# Patient Record
Sex: Male | Born: 1935 | Race: White | Hispanic: No | Marital: Married | Smoking: Former smoker
Health system: Southern US, Community
[De-identification: ages and names within clinical notes are randomized; demographics above are authoritative.]

## PROBLEM LIST (undated history)

## (undated) DIAGNOSIS — F0280 Dementia in other diseases classified elsewhere without behavioral disturbance: Secondary | ICD-10-CM

## (undated) DIAGNOSIS — G309 Alzheimer's disease, unspecified: Secondary | ICD-10-CM

## (undated) DIAGNOSIS — I10 Essential (primary) hypertension: Secondary | ICD-10-CM

## (undated) HISTORY — DX: Dementia in other diseases classified elsewhere, unspecified severity, without behavioral disturbance, psychotic disturbance, mood disturbance, and anxiety: F02.80

## (undated) HISTORY — DX: Essential (primary) hypertension: I10

## (undated) HISTORY — DX: Alzheimer's disease, unspecified: G30.9

---

## 2003-11-01 ENCOUNTER — Ambulatory Visit: Payer: Self-pay | Admitting: General Surgery

## 2005-08-02 ENCOUNTER — Ambulatory Visit: Payer: Self-pay | Admitting: General Surgery

## 2005-12-04 ENCOUNTER — Ambulatory Visit: Payer: Self-pay | Admitting: Urology

## 2005-12-16 ENCOUNTER — Ambulatory Visit: Payer: Self-pay | Admitting: Internal Medicine

## 2006-02-18 ENCOUNTER — Inpatient Hospital Stay: Payer: Self-pay | Admitting: Urology

## 2006-02-19 ENCOUNTER — Other Ambulatory Visit: Payer: Self-pay

## 2006-02-23 ENCOUNTER — Emergency Department: Payer: Self-pay | Admitting: Internal Medicine

## 2006-03-24 ENCOUNTER — Ambulatory Visit: Payer: Self-pay | Admitting: Urology

## 2006-03-27 ENCOUNTER — Ambulatory Visit: Payer: Self-pay | Admitting: Urology

## 2008-03-15 ENCOUNTER — Ambulatory Visit: Payer: Self-pay | Admitting: Specialist

## 2008-03-23 ENCOUNTER — Ambulatory Visit: Payer: Self-pay | Admitting: Specialist

## 2008-10-27 IMAGING — CR DG ABDOMEN 3V
1 series · 5 of 5 positions shown · non-contrast
Comparison: none

REASON FOR EXAM: abdominal pain, vomiting, [HOSPITAL]
COMMENTS:

[Series 1: view not recorded · 0.17mm/px · 5 of 5 slices shown]
[im 1/5]
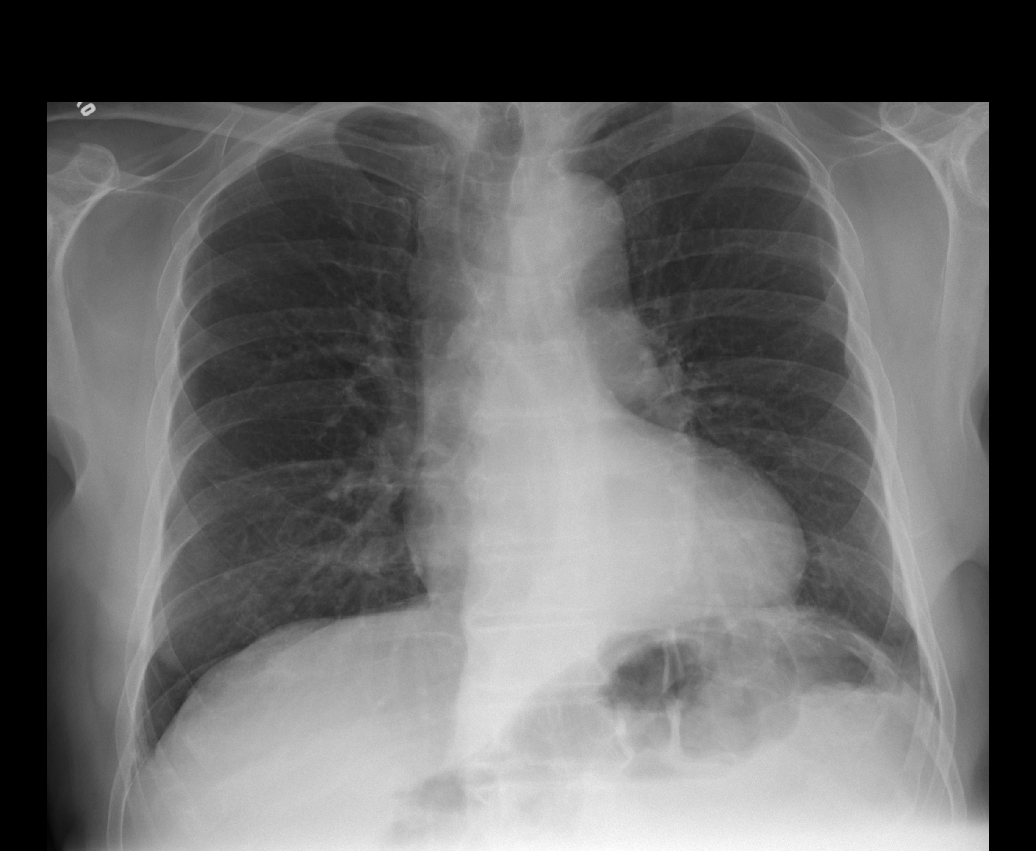
[im 2/5]
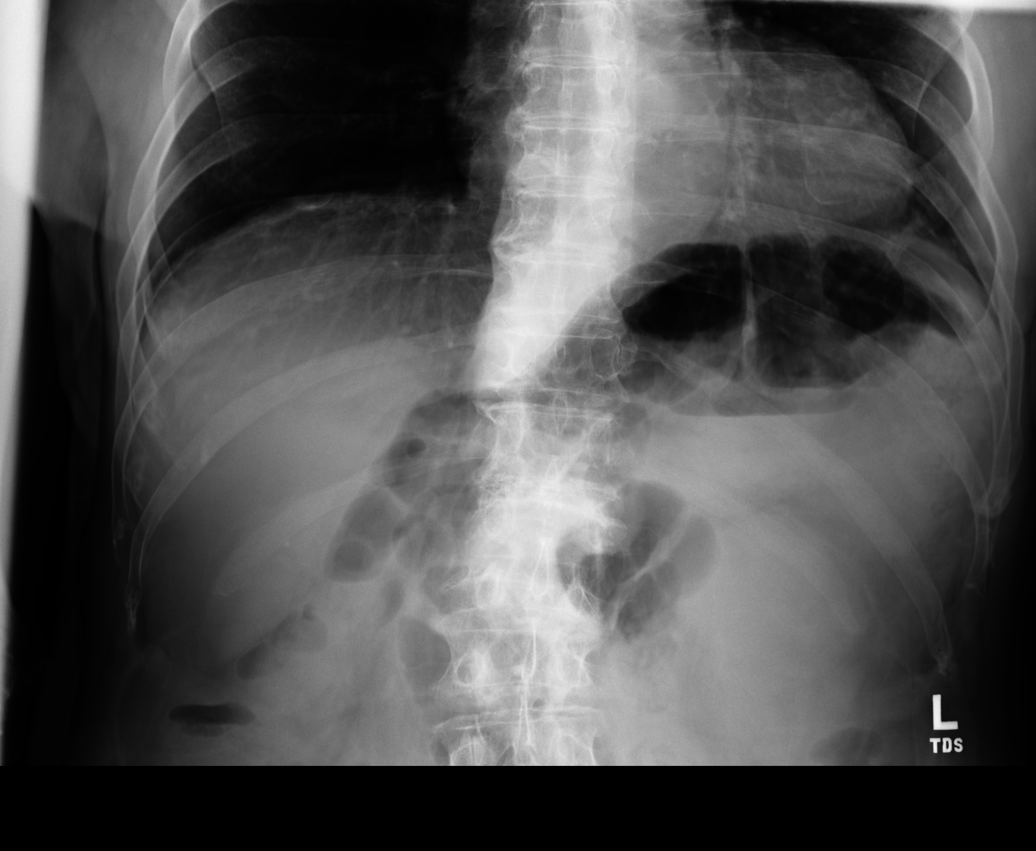
[im 3/5]
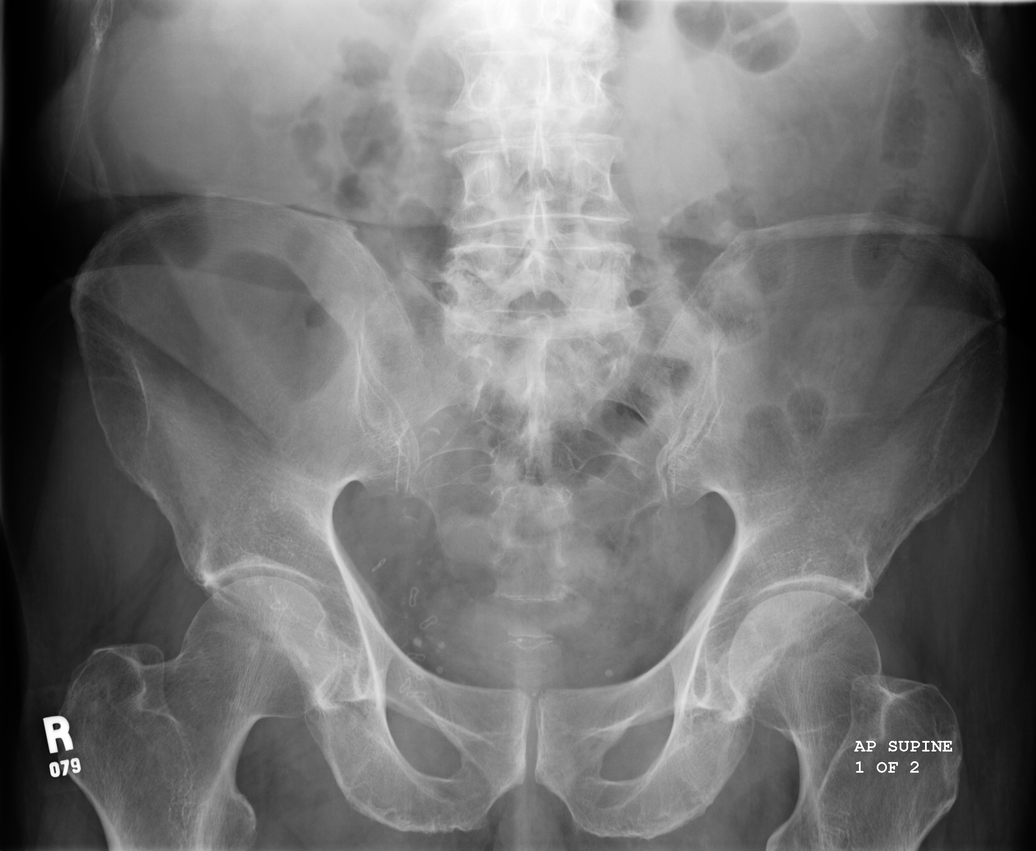
[im 4/5]
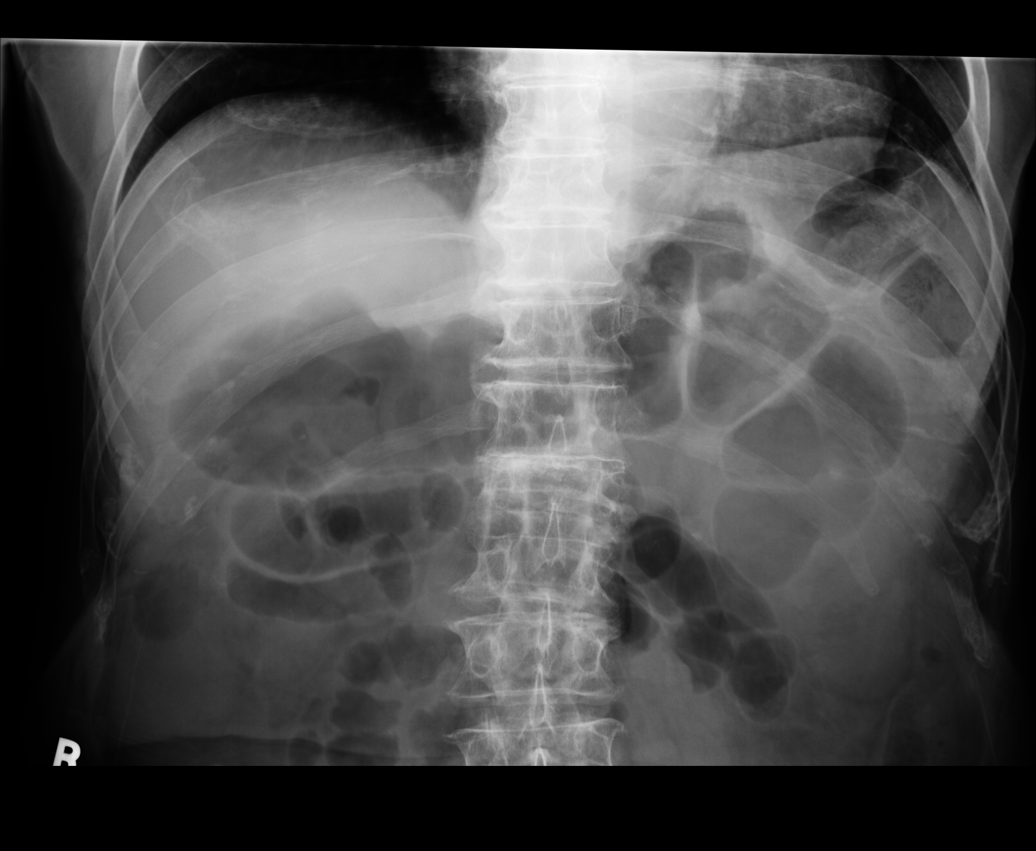
[im 5/5]
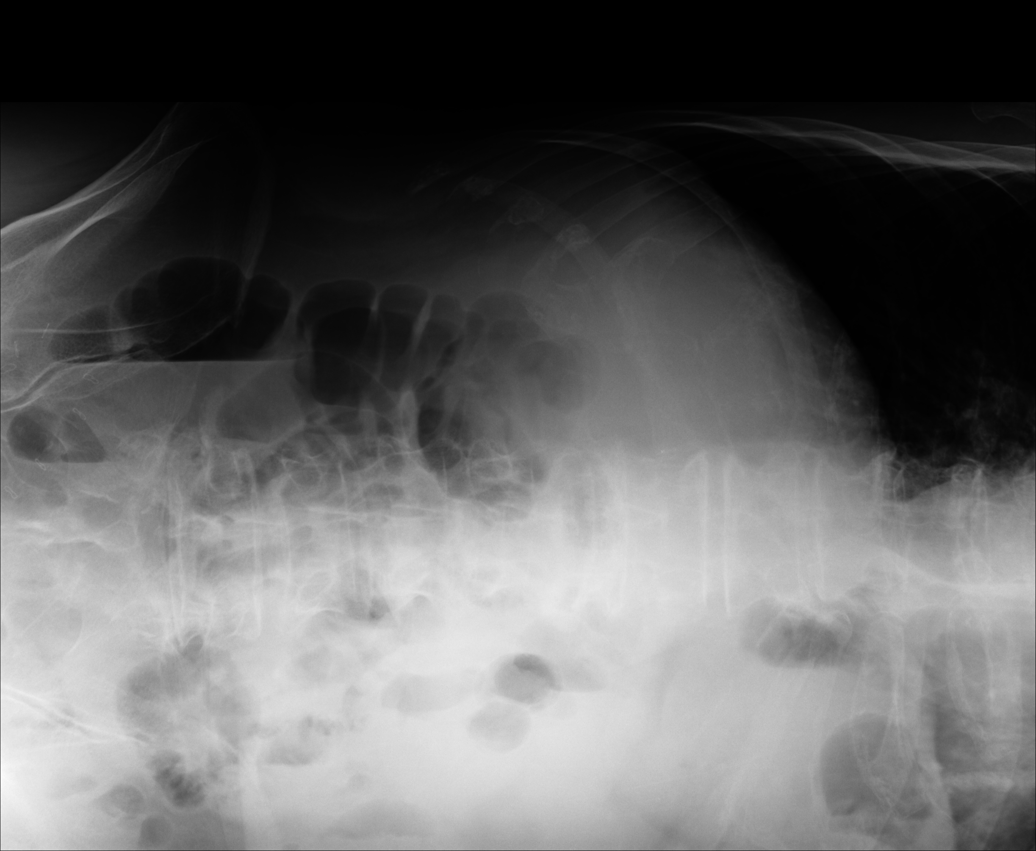

[5 of 5 positions shown; findings below may reference images not displayed]

PROCEDURE:     DXR - DXR ABDOMEN 3-WAY (INCL PA CXR)  - February 18, 2006  [DATE]

RESULT:     The patient is complaining of abdominal discomfort.

The chest film is compared to a study 16 December, 2005. The lungs are
adequately inflated and clear. The heart is top normal in size. There is
tortuosity of the descending thoracic aorta. There is no evidence of CHF or
pneumonia.

Views of the abdomen reveal a relatively nonspecific bowel gas pattern.
There is no evidence of obstruction, perforation, or ileus. There are
phleboliths within the pelvis. There is some gas within loops of small bowel.
IMPRESSION: 1. There is no evidence of bowel obstruction or perforation. There may be
gastroenteritis present manifested by the small amount of gas within small
bowel loops as well as in the large bowel. Follow-up films or CT scanning
would be of value.
2. I do not see evidence of acute cardiopulmonary abnormality.
3. The patient has sustained a partial compression of the body of
approximately L1.

## 2008-11-01 IMAGING — CR DG ABDOMEN 1V
1 series · 1 of 1 positions shown · non-contrast
Comparison: none

REASON FOR EXAM: Kidney stones
COMMENTS:

[view not recorded]
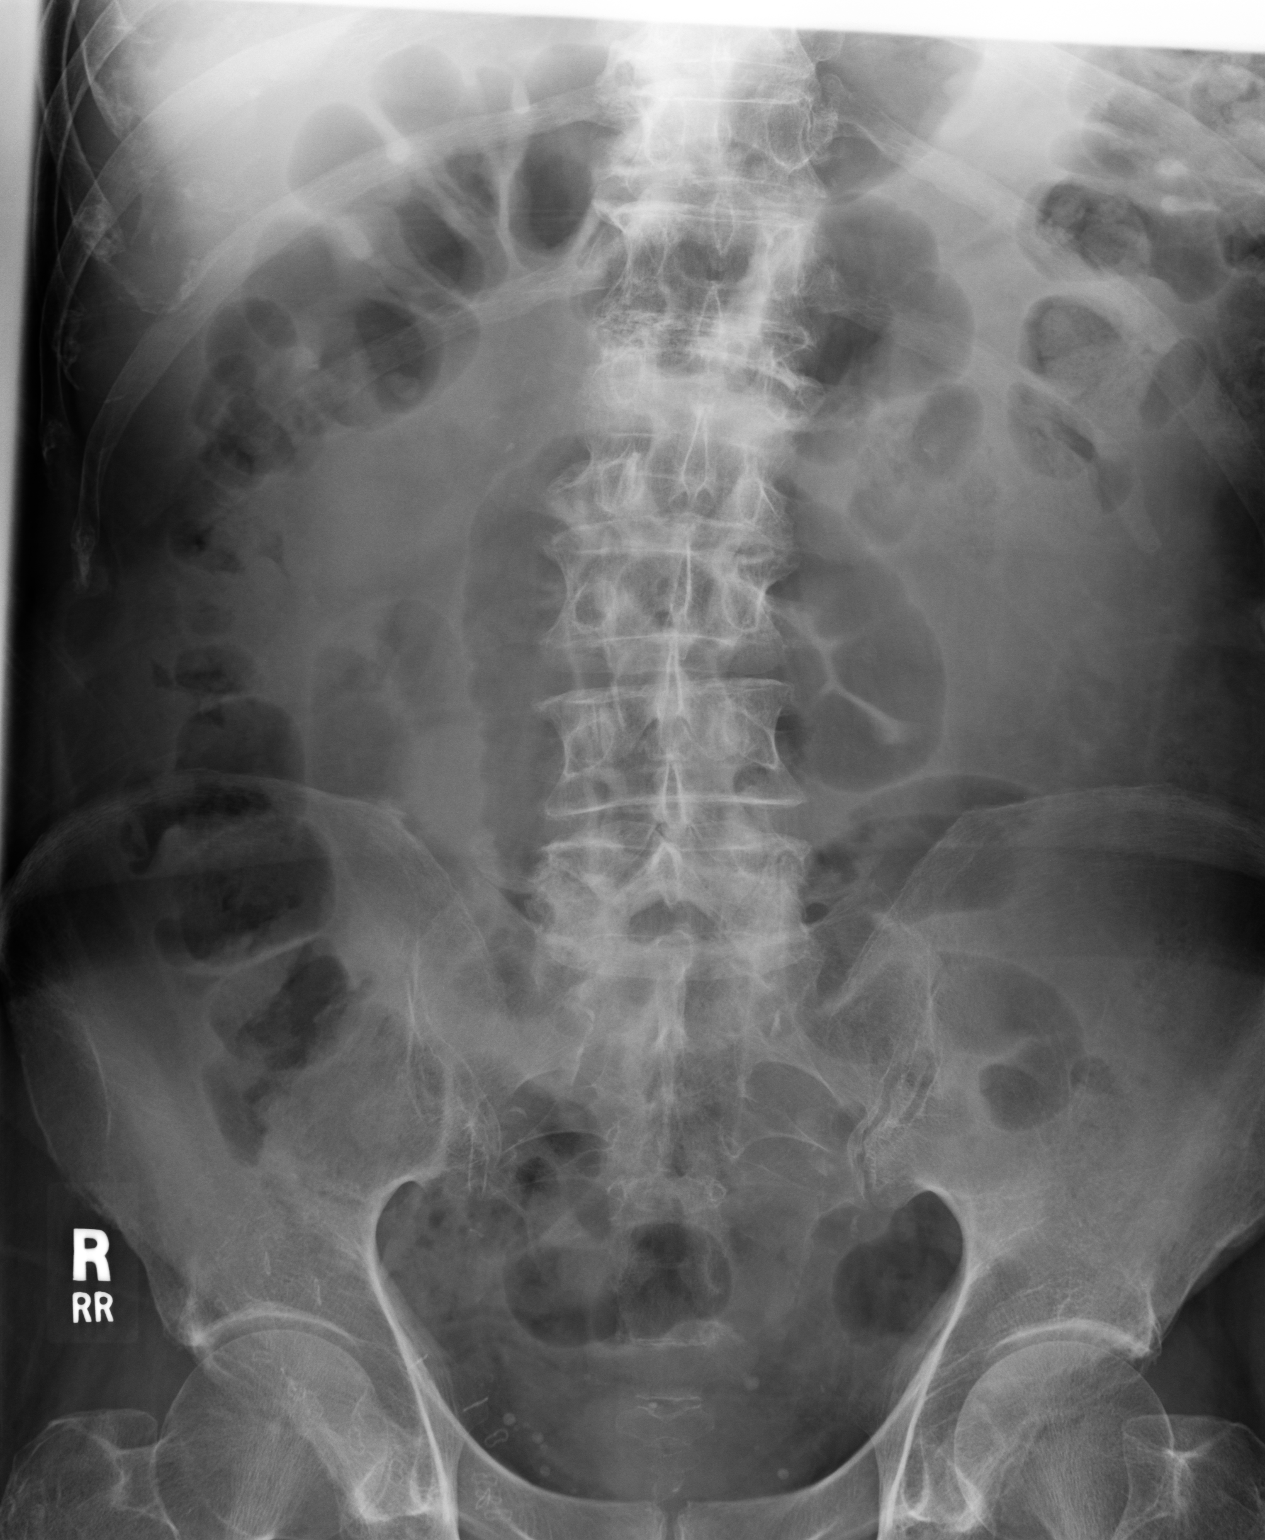

[1 of 1 positions shown; findings below may reference images not displayed]

PROCEDURE:     DXR - DXR KIDNEY URETER BLADDER  - February 23, 2006  [DATE]

RESULT:

The bowel gas pattern is relatively nonspecific. There is no evidence of
obstruction or ileus. There is a moderate amount of gas within loops of
small bowel.  I do not see evidence of extraluminal gas collections.  There
are phleboliths within the pelvis.  There are degenerative changes of lumbar
spine and there is likely a compression of the body of T12.
IMPRESSION: I do not see acute intra-abdominal abnormality on this study. Specifically,
I do not see objective evidence of calcified urinary tract stones. There is
considerable stool and gas overlying the kidneys that could certainly
obscure tiny stones. Further evaluation with CT scanning or IVP is available
upon request.

## 2009-10-30 ENCOUNTER — Ambulatory Visit: Payer: Self-pay | Admitting: Internal Medicine

## 2011-10-01 ENCOUNTER — Ambulatory Visit: Payer: Self-pay | Admitting: Neurology

## 2012-07-15 ENCOUNTER — Inpatient Hospital Stay: Payer: Self-pay | Admitting: Internal Medicine

## 2012-07-15 LAB — URINALYSIS, COMPLETE
Blood: NEGATIVE
Glucose,UR: NEGATIVE mg/dL (ref 0–75)
Ketone: NEGATIVE
Leukocyte Esterase: NEGATIVE
Nitrite: NEGATIVE
RBC,UR: <1 /HPF (ref 0–5)
Specific Gravity: 1.018 (ref 1.003–1.030)
Squamous Epithelial: NONE SEEN

## 2012-07-15 LAB — CBC WITH DIFFERENTIAL/PLATELET
Basophil #: 0.1 10*3/uL (ref 0.0–0.1)
Basophil #: 0.1 10*3/uL (ref 0.0–0.1)
Basophil %: 1.6 %
Basophil %: 1.5 %
Eosinophil #: 0 10*3/uL (ref 0.0–0.7)
Eosinophil %: 0.1 %
Eosinophil %: 0.6 %
HCT: 21.4 % — ABNORMAL LOW (ref 40.0–52.0)
Lymphocyte #: 1.5 10*3/uL (ref 1.0–3.6)
Lymphocyte %: 14.3 %
MCH: 27.6 pg (ref 26.0–34.0)
MCH: 28.4 pg (ref 26.0–34.0)
MCHC: 32.7 g/dL (ref 32.0–36.0)
MCHC: 33.8 g/dL (ref 32.0–36.0)
MCV: 84 fL (ref 80–100)
Monocyte #: 0.7 x10 3/mm (ref 0.2–1.0)
Monocyte %: 12.3 %
Monocyte %: 11.2 %
Neutrophil #: 4.3 10*3/uL (ref 1.4–6.5)
Neutrophil #: 4.5 10*3/uL (ref 1.4–6.5)
Neutrophil %: 71.7 %
Platelet: 230 10*3/uL (ref 150–440)
Platelet: 183 10*3/uL (ref 150–440)
RBC: 2.54 10*6/uL — ABNORMAL LOW (ref 4.40–5.90)
RDW: 14.7 % — ABNORMAL HIGH (ref 11.5–14.5)
RDW: 15.3 % — ABNORMAL HIGH (ref 11.5–14.5)
WBC: 6.9 10*3/uL (ref 3.8–10.6)

## 2012-07-15 LAB — COMPREHENSIVE METABOLIC PANEL
Alkaline Phosphatase: 59 U/L (ref 50–136)
Anion Gap: 10 (ref 7–16)
BUN: 32 mg/dL — ABNORMAL HIGH (ref 7–18)
Bilirubin,Total: 0.3 mg/dL (ref 0.2–1.0)
Chloride: 108 mmol/L — ABNORMAL HIGH (ref 98–107)
Co2: 21 mmol/L (ref 21–32)
EGFR (African American): 53 — ABNORMAL LOW
EGFR (Non-African Amer.): 45 — ABNORMAL LOW
Glucose: 115 mg/dL — ABNORMAL HIGH (ref 65–99)
Osmolality: 285 (ref 275–301)
Potassium: 3.7 mmol/L (ref 3.5–5.1)
SGOT(AST): 17 U/L (ref 15–37)
Sodium: 139 mmol/L (ref 136–145)
Total Protein: 6 g/dL — ABNORMAL LOW (ref 6.4–8.2)

## 2012-07-15 LAB — PROTIME-INR
INR: 1.1
Prothrombin Time: 14.8 secs — ABNORMAL HIGH (ref 11.5–14.7)

## 2012-07-15 LAB — APTT: Activated PTT: 24.6 secs (ref 23.6–35.9)

## 2012-07-16 LAB — CBC WITH DIFFERENTIAL/PLATELET
Basophil #: 0.1 10*3/uL (ref 0.0–0.1)
Eosinophil #: 0.1 10*3/uL (ref 0.0–0.7)
Eosinophil %: 0.9 %
HCT: 22.6 % — ABNORMAL LOW (ref 40.0–52.0)
HGB: 7.8 g/dL — ABNORMAL LOW (ref 13.0–18.0)
Lymphocyte #: 0.8 10*3/uL — ABNORMAL LOW (ref 1.0–3.6)
Lymphocyte %: 12.8 %
MCHC: 34.6 g/dL (ref 32.0–36.0)
MCV: 84 fL (ref 80–100)
Monocyte #: 0.7 x10 3/mm (ref 0.2–1.0)
Monocyte %: 11.1 %
Neutrophil %: 74.2 %
Platelet: 187 10*3/uL (ref 150–440)
RBC: 2.69 10*6/uL — ABNORMAL LOW (ref 4.40–5.90)
RDW: 15.1 % — ABNORMAL HIGH (ref 11.5–14.5)

## 2012-07-16 LAB — BASIC METABOLIC PANEL
Anion Gap: 6 — ABNORMAL LOW (ref 7–16)
EGFR (African American): >60
Glucose: 94 mg/dL (ref 65–99)
Osmolality: 282 (ref 275–301)
Sodium: 139 mmol/L (ref 136–145)

## 2012-07-16 LAB — OCCULT BLOOD X 1 CARD TO LAB, STOOL: Occult Blood, Feces: POSITIVE

## 2012-07-17 LAB — HEMOGLOBIN: HGB: 7.8 g/dL — ABNORMAL LOW (ref 13.0–18.0)

## 2014-05-20 NOTE — Consult Note (Signed)
Brief Consult Note: Diagnosis: Low hemoglobin.   Patient was seen by consultant.   Consult note dictated.   Comments: Appreciate consult for 81 y/o caucasian man with history of atrial fibrillation, s/p Pradaxa therapy which has been recently held, for evaluation for concerns of GI bleeding. Pt and son report that he has been in his usual state of health until this week, when he became increasingly weak, sob, and fatigued. They also report a history of intermittent black stools over the last month. Last occurence was today. States he has occasional heartburn, but only if he eats too fast. States he does not take NSAIDs, but does have a headache pill he cannot remember the name of.  Denies abdominal pain, NVD, problems swallowing, and all further GI complaints. No history of EGD. Did have colonoscopy 2005 by Dr Jamal Collin that revealed a normal colon per report. Hgb today was 4.4, normal platelets. PT 14.8, INR 1.1. He is hemodynamically stable. Impression and Plan: Melena, Anemia: concerning for GIB- slow over the last month. He is received PRBCS, would slowly transfuse up to hgb of 8. Agree with PPI therapy. Would also recommend serial hgbs and will plan for EGD when clinically feasible.  Electronic Signatures: Stephens November H (NP)  (Signed 18-Jun-14 18:19)  Authored: Brief Consult Note   Last Updated: 18-Jun-14 18:19 by Wallice Demark (NP)

## 2014-05-20 NOTE — Consult Note (Signed)
Chief Complaint:  Subjective/Chief Complaint Please see EGD report.  No active bleeding lesion and no lesion with reliable stigmata.  Possible sources include erosive esophagitis, erosive gastritis.  Patietn also has some duodenitis.  Bleeding form one of the above likely much exacerbated by the anticoagulation.   Will get h. pylori serology.  continue bid ppi, also as outpatient.  GI o/p fu in 3 weeks.   Electronic Signatures: Loistine Simas (MD)  (Signed 19-Jun-14 13:38)  Authored: Chief Complaint   Last Updated: 19-Jun-14 13:38 by Loistine Simas (MD)

## 2014-05-20 NOTE — H&P (Signed)
PATIENT NAME:  Kyle Barron, Kyle Barron MR#:  [id number] DATE OF BIRTH:  1920-12-12  DATE OF ADMISSION:  07/15/2012  PRIMARY CARE PHYSICIAN: Jaquelyn Bitter B. Brynda Greathouse, MD  PRIMARY CARDIOLOGIST: Cletis Athens, MD   CHIEF COMPLAINT: Low hemoglobin.   HISTORY OF PRESENT ILLNESS: A very pleasant 81 year old male with a history of atrial fibrillation, he was on Pradaxa, who presented with the above complaint. Over the past several days, the patient has been very lethargic, short of breath and had dyspnea on exertion. They went to see his PCP yesterday, where they did a hemoglobin. They got a call today saying his hemoglobin is critically low, to come to the ER for further evaluation. Indeed, his hemoglobin is 4.4 here in the ER with a hematocrit of 13.3. He has consented packed red blood cells.   REVIEW OF SYSTEMS:  CONSTITUTIONAL: No fever. Positive fatigue, weakness. EYES: No blurred or double vision.  ENT: No ear pain, hearing loss, seasonal allergies, postnasal drip.  RESPIRATORY: No cough, wheezing, hemoptysis, COPD. Positive dyspnea.  CARDIOVASCULAR: No chest pain, palpitations, orthopnea, edema, arrhythmia. Positive dyspnea on exertion.  GASTROINTESTINAL: No nausea, vomiting, diarrhea, abdominal pain. He has had melena.  GENITOURINARY: No dysuria or hematuria.  ENDOCRINE: No polyuria or polydipsia.  HEMATOLOGIC AND LYMPHATIC: Positive easy bruising on Pradaxa.  SKIN: No rashes or lesions.  MUSCULOSKELETAL: No limited activity. He has been working 8 to 10 hours up until about a couple weeks ago.  PSYCHIATRIC: No history of anxiety.   PAST MEDICAL HISTORY: 1. Atrial fibrillation.  2. Hypertension.  3. BPH.  4. Dementia.   PAST SURGICAL HISTORY: Hernia repair.   MEDICATIONS:  1. Vitamin D3 one tablet daily.  2. Losartan 50 mg daily.  3. Synthroid 100 mcg daily.  4. Doxazosin 4 mg daily.  5. Atenolol 25 mg daily.  6. Aspirin 81 mg daily.  He was recently taken off of Abilify and Pradaxa.    ALLERGIES: DEMEROL.   SOCIAL HISTORY: No tobacco, alcohol or drug use.   FAMILY HISTORY: Positive for diabetes.   PHYSICAL EXAMINATION:  VITAL SIGNS: Temperature 97.8, pulse is 96, respirations 16, blood pressure 96/55, 92% on room air.  GENERAL: The patient is alert, oriented, not in acute distress.  HEENT: Head is atraumatic. Pupils are round and reactive. Sclerae are anicteric. Mucous membranes are dry. Oropharynx is clear.  NECK: Supple without JVD, carotid bruit or enlarged thyroid.  CARDIOVASCULAR: Regular rate and rhythm. He has a 3/6 systolic ejection murmur heard best at the right sternal border, without radiation. PMI is not displaced.  LUNGS: Clear to auscultation without crackles, rales, rhonchi or wheezing. Normal percussion. Good respiratory effort. Symmetrical rise and fall.  ABDOMEN: Bowel sounds are positive. Nontender, nondistended. No hepatosplenomegaly. No rebound, guarding, rigidity or ecchymosis.  EXTREMITIES: No clubbing, cyanosis or edema.  NEUROLOGIC: Cranial nerves II through XII are grossly intact. There are no focal deficits. Motor strength 4/4 bilateral upper and lower extremities.  SKIN: The patient is pale, but no rash or lesions are noted.   LABORATORY DATA: White blood cells 5.9, hemoglobin 4.4, hematocrit 13.3, platelets are 230. Sodium 139, potassium 3.7, chloride 108, bicarbonate 21, BUN 32, creatinine 1.36, glucose 115, calcium 8.4, bilirubin 0.3, alkaline phosphatase 59, AST 17, ALT 18, total protein 6.0, albumin 3.1. INR is 1.1.   ELECTROCARDIOGRAM: Pending.   ASSESSMENT AND PLAN: A 81 year old male who presents with melena, dyspnea on exertion, shortness of breath, fatigue, with hemoglobin of 4.4, who was on Pradaxa.   1. Upper  gastrointestinal bleed secondary to Pradaxa. The patient has consented for PRBCs. He will be transfused 2 units of PRBCs. I have placed him on Protonix 40 IV q.12 hours. Will hold p.o. medications for now, and GI consultation  has been placed.  2. History of hypertension. Now, the patient is relatively hypotensive due to problem #1. Will hold all medications.  3. Dementia. The patient has recently been taken off of his Abilify.   4. Renal insufficiency. Creatinine is 1.36. I suspect this is either chronic in nature or related to his hypoperfusion from his significant anemia. Will monitor closely and repeat a BMP in the a.m.   CODE STATUS: The patient is a full code status.   CRITICAL CARE TIME SPENT: Approximately 50 minutes.   ____________________________ Donell Beers. Benjie Karvonen, MD spm:OSi D: 07/15/2012 12:29:07 ET T: 07/15/2012 13:32:37 ET JOB#: [id number]  cc: Latrese Carolan P. Benjie Karvonen, MD, <Dictator> Cletis Athens, MD Mikeal Hawthorne. Brynda Greathouse, MD Donell Beers Darlis Wragg MD ELECTRONICALLY SIGNED 07/15/2012 14:52

## 2014-05-20 NOTE — Discharge Summary (Signed)
PATIENT NAME:  Kyle Barron, Kyle Barron MR#:  [id number] DATE OF BIRTH:  03-16-1920  DATE OF ADMISSION:  07/15/2012 DATE OF DISCHARGE:  07/17/2012  ADMISSION DIAGNOSIS: Upper gastrointestinal bleed.   DISCHARGE DIAGNOSES: 1. Upper gastrointestinal bleed secondary to grade C esophagitis, any gastritis.  2. Acute blood loss anemia, status post 2 units of PRBCs.  3. History of atrial fibrillation.  4. Hypertension.   CONSULTATION: Dr. Nehemiah Massed.   PROCEDURE:  07/16/2012, the patient underwent an EGD which showed LA grade C erosive esophagitis and erosive gastritis and duodenitis with short segment Barrett's esophagus.   Discharge hemoglobin is 7.8.  H. pylori is pending.    HOSPITAL COURSE: A 81 year old male who was on Pradaxa has presented with a low hemoglobin from his doctor's office. For further details, please refer to the H and P.   Upper GI bleed. The patient has been on Pradaxa. This is likely a slow bleed from the Pradaxa.  His EGD showed grade C esophagitis, gastritis, duodenitis.  GI recommended PPI for 6 weeks b.i.d., which he will continue on daily. He obviously should not be a Pradaxa, aspirin was held as well.   Acute blood loss anemia due to upper GI bleed, status post 2 units of PRBCs. His hemoglobin has remained stable.   History of atrial fibrillation: The patient is now off of aspirin and Pradaxa. He will need to follow up with his physician but due to the upper GI bleed; this is unsafe for him at this time.   Hypertension: The patient was restarted on his outpatient medications.   DISCHARGE MEDICATIONS: 1. Synthroid 100 mcg daily.  2. Atenolol 25 mg daily.  3. Vitamin D3 1 tablet daily. 4. Pantoprazole 40 mg 1 tablet b.i.d. for 6 weeks, then once daily.   DISCHARGE DIET: Regular diet.   DISCHARGE ACTIVITY: As tolerated. We have taken the patient off of Losartan and Zosyn due to normotensive blood pressures. He will monitor his blood pressures and then restart if  systolic blood pressure greater than 140.  He will need to follow up with his primary care physician in 1 to 2 days.   TIME SPENT:  Approximately 35 minutes. The patient is medically stable for discharge.   ____________________________ Romesha Scherer P. Benjie Karvonen, MD spm:rw D: 07/17/2012 13:56:07 ET T: 07/17/2012 15:32:27 ET JOB#: [id number]  cc: Rosaisela Jamroz P. Benjie Karvonen, MD, <Dictator> Mikeal Hawthorne. Brynda Greathouse, MD Donell Beers Brance Dartt MD ELECTRONICALLY SIGNED 07/17/2012 21:06

## 2014-05-20 NOTE — Consult Note (Signed)
PATIENT NAME:  Kyle, Barron MR#:  [id number] DATE OF BIRTH:  May 13, 1920  DATE OF CONSULTATION:  07/15/2012  CONSULTING PHYSICIAN:  Danie Demark, NP  PRIMARY CARE PHYSICIAN: Dr. Brynda Greathouse.  PRIMARY CARDIOLOGIST:  Dr.  Lavera Guise.   HISTORY OF PRESENT ILLNESS: [name] is a very pleasant 81 year old Caucasian man with a history of atrial fibrillation, status post Pradaxa therapy and hypertension who has been recently admitted for low hemoglobin. GI has been consulted at the request of Dr. Benjie Karvonen to evaluate for upper GI bleed. The patient has been on Pradaxa therapy until recently in which it was held and he was sent to the hospital with an extremely low hemoglobin for evaluation. The patient and son report that he has been in his usual state of health until this week when he became increasingly weak, short of breath and fatigued. They also report a history of intermittent black stools over the last month. Last occurrence was today, states he has had occasional heartburn, but only if he eats too fast. States he does not take NSAIDs, but does have a headache pill he cannot remember the name of. He denies abdominal pain, nausea, vomiting, diarrhea, problems swallowing and all further GI complaints. No history of EGD. Did have colonoscopy in 2005 by Dr. Jamal Collin that revealed a normal colon per report. Hemoglobin today was 4.4. His platelets were normal. PT 14.8, INR 1; he is hemodynamically stable.   PAST MEDICAL HISTORY: Hypertension, valvular disease, atrial fibrillation, benign prosthetic hypertrophy, dementia, elbow repair, hernia repair.   MEDICATIONS: Unknown, headache pill, vitamin D3 once daily, losartan 50 mg p.o. daily,  Synthroid 100 mcg p.o. daily, doxazosin 4 mg p.o. daily, atenolol 25 mg p.o. daily, aspirin 81 mg p.o. daily.  He was on Abilify and Pradaxa until recently.   ALLERGIES: DEMEROL.   SOCIAL HISTORY: No tobacco, alcohol, or drugs. Lives with family.   FAMILY HISTORY:  Significant for diabetes. Negative for colorectal cancer, liver disease, colon polyps, ulcers.   REVIEW OF SYSTEMS: Ten systems reviewed, unremarkable other than what is noted above and the exception of some memory loss, consistent with dementia.   LABORATORY, DIAGNOSTIC AND RADIOLOGIC DATA: Glucose 115, BUN 32, creatinine 1.36, sodium 139, potassium 3.7, GFR 45, calcium 8.4, total protein 6, albumin 3.1, total bilirubin 0.3, ALP 59, AST 17, ALT 18. WBC 5.9, hemoglobin 4.4, hematocrit 13.3, platelet count 230. Red cells are normocytic with increased RDW. He is O+. PT 14.8, INR 1.1.   PHYSICAL EXAMINATION: VITAL SIGNS: Most recent vital signs: Temperature 98.4, pulse 86, respiratory rate 14, blood pressure 120/71, SAO2 94% on room air.  GENERAL:  Comfortable -appearing Caucasian man lying in bed in no acute distress.  HEENT: Normocephalic, atraumatic. Conjunctivae are pale, pink. No redness, drainage or erythema to the eyes, nares or mouth.  NECK: Supple. No JVD, thyromegaly, lymphadenopathy.  CARDIOVASCULAR: S1 and S2. Rhythm somewhat irregular. No MRG. No appreciable edema.  PULMONARY: Lungs are clear to auscultation bilaterally. Respirations eupneic.  ABDOMEN: Bowel sounds x 4, nondistended, very soft, nontender. No guarding, peritoneal signs, rebound, tenderness, rigidity. No hepatosplenomegaly or masses.  EXTREMITIES: Moves all extremities well x 4. Sensation appears to be intact. No clubbing, cyanosis.  NEUROLOGIC: Somewhat forgetful, alert, oriented x 3. Cranial nerves II through XII intact.  SKIN: Warm and dry. Pale, pink.  PSYCHIATRIC: Calm, cooperative, pleasant.   IMPRESSION AND PLAN: Melena, anemia concerning for a GI bleed, slow over the last month. He is currently receiving packed red blood cell,  which slowly transfused this up to hemoglobin of 8t. Agree with proton pump inhibitor therapy. Would also recommend serial hemoglobins, and plan for EGD when clinically feasible. He may also  have a clear liquid diet with nothing red and no carbonation.   These services were provided by Stephens November, MSN, [facility] in collaboration with Loistine Simas, M.D. with whom I have discussed this patient in full.   Thank you very much for this consult   ____________________________ Kyle Demark, NP chl:cc D: 07/15/2012 18:26:44 ET T: 07/15/2012 19:13:13 ET JOB#: [id number]  cc: Kyle Demark, NP, <Dictator> [place] SIGNED 07/22/2012 14:38

## 2014-05-20 NOTE — Consult Note (Signed)
Chief Complaint:  Subjective/Chief Complaint Kyle Barron seen and examined, chart reviewed, please see full Gi consult.  Patient admitted with profound anemia. H/O black stools for abut a month to 3 weeks intermittantly.  last stool this am  more brown than it has been.  Denies prior history of PUD or upper scope.  Colonoscopy several years ago with polyps removed.   Patient on low dose asa and pradaxa as o/p, these held 1-2 days ago.  Hemodynamically stable, tolerating tfx.   ZOX:WRUEA GI bleeding, likely PUDz exacerbated by asa/pradaxa.  Continue iv ppi as you are, serial cbc, transfuse as needed.  Will plan for egd tomorrow pm as clinically feasible.  I have discussed the risks benefits and complications of egd to include not limited to bleeding infection perforation and sedation and he wishes to proceed. Will need anesthesia assistance, ASAIII.   VITAL SIGNS/ANCILLARY NOTES: **Vital Signs.:   18-Jun-14 18:18  Vital Signs Type Pre-Blood  Temperature Temperature (F) 98.4  Celsius 36.8  Temperature Source oral  Pulse Pulse 86  Respirations Respirations 21  Systolic BP Systolic BP 540  Diastolic BP (mmHg) Diastolic BP (mmHg) 66  Mean BP 83  BP Source  if not from Vital Sign Device non-invasive  Pulse Ox % Pulse Ox % 82  Oxygen Delivery Room Air/ 21 %   Brief Assessment:  Cardiac Irregular   Respiratory clear BS   Gastrointestinal details normal Soft  Nontender  Nondistended  No masses palpable  Bowel sounds normal   Lab Results: Routine Chem:  18-Jun-14 10:42   BUN  32  Routine Coag:  18-Jun-14 10:42   INR 1.1 (INR reference interval applies to patients on anticoagulant therapy. A single INR therapeutic range for coumarins is not optimal for all indications; however, the suggested range for most indications is 2.0 - 3.0. Exceptions to the INR Reference Range may include: Prosthetic heart valves, acute myocardial infarction, prevention of myocardial infarction, and combinations of  aspirin and anticoagulant. The need for a higher or lower target INR must be assessed individually. Reference: The Pharmacology and Management of the Vitamin K  antagonists: the seventh ACCP Conference on Antithrombotic and Thrombolytic Therapy. JWJXB.1478 Sept:126 (3suppl): [id number]. A HCT value >55% may artifactually increase the PT.  In one study,  the increase was an average of 25%. Reference:  "Effect on Routine and Special Coagulation Testing Values of Citrate Anticoagulant Adjustment in Patients with High HCT Values." American Journal of Clinical Pathology 2006;126:400-405.)  Routine Hem:  18-Jun-14 10:42   WBC (CBC) 5.9  RBC (CBC)  1.58  Hemoglobin (CBC)  4.4  Hematocrit (CBC)  13.3  Platelet Count (CBC) 230  MCV 84  MCH 27.6  MCHC 32.7  RDW  14.7  Neutrophil % 71.7  Lymphocyte % 14.3  Monocyte % 12.3  Eosinophil % 0.1  Basophil % 1.6  Neutrophil # 4.3  Lymphocyte #  0.8  Monocyte # 0.7  Eosinophil # 0.0  Basophil # 0.1 (Result(s) reported on 15 Jul 2012 at 11:15AM.)   Assessment/Plan:  Assessment/Plan:  Assessment as above   Electronic Signatures: Loistine Simas (MD)  (Signed 18-Jun-14 18:35)  Authored: Chief Complaint, VITAL SIGNS/ANCILLARY NOTES, Brief Assessment, Lab Results, Assessment/Plan   Last Updated: 18-Jun-14 18:35 by Loistine Simas (MD)

## 2014-05-20 NOTE — Consult Note (Signed)
Chief Complaint:  Subjective/Chief Complaint one bm overnight, black.  no n/v or abdominal pain.   VITAL SIGNS/ANCILLARY NOTES: **Vital Signs.:   19-Jun-14 07:00  Vital Signs Type Routine  Temperature Temperature (F) 98.6  Celsius 37  Temperature Source oral  Pulse Pulse 102  Respirations Respirations 20  Pulse Ox % Pulse Ox % 95  Pulse Ox Activity Level  At rest  Oxygen Delivery Room Air/ 21 %  Pulse Ox Heart Rate 88    12:20  Vital Signs Type Routine  Pulse Pulse 80  Systolic BP Systolic BP 127  Diastolic BP (mmHg) Diastolic BP (mmHg) 70  Mean BP 94  Pulse Ox % Pulse Ox % 85  Pulse Ox Activity Level  At rest  Oxygen Delivery Room Air/ 21 %  *Intake and Output.:   19-Jun-14 11:00  Stool  moderate amount of dark tarry stool   Brief Assessment:  Cardiac Irregular   Respiratory clear BS   Gastrointestinal details normal Soft  Nontender  Nondistended  No masses palpable   Lab Results: Routine Chem:  18-Jun-14 10:42   BUN  32  19-Jun-14 04:48   Glucose, Serum 94  BUN  25  Sodium, Serum 139  Potassium, Serum 3.6  Chloride, Serum  109  CO2, Serum 24  Calcium (Total), Serum  7.8  Anion Gap  6  Osmolality (calc) 282  eGFR (African American) >60  eGFR (Non-African American)  55 (eGFR values <35m/min/1.73 m2 may be an indication of chronic kidney disease (CKD). Calculated eGFR is useful in patients with stable renal function. The eGFR calculation will not be reliable in acutely ill patients when serum creatinine is changing rapidly. It is not useful in  patients on dialysis. The eGFR calculation may not be applicable to patients at the low and high extremes of body sizes, pregnant women, and vegetarians.)  Routine Hem:  18-Jun-14 10:42   Hemoglobin (CBC)  4.4    22:00   Hemoglobin (CBC)  7.2  19-Jun-14 04:48   WBC (CBC) 6.6  RBC (CBC)  2.69  Hemoglobin (CBC)  7.8  Hematocrit (CBC)  22.6  Platelet Count (CBC) 187  MCV 84  MCH 29.0  MCHC 34.6  RDW  15.1   Neutrophil % 74.2  Lymphocyte % 12.8  Monocyte % 11.1  Eosinophil % 0.9  Basophil % 1.0  Neutrophil # 4.9  Lymphocyte #  0.8  Monocyte # 0.7  Eosinophil # 0.1  Basophil # 0.1 (Result(s) reported on 16 Jul 2012 at 05:27AM.)   Radiology Results: Cardiology:    18-Jun-14 13:00, ECG  ECG interpretation   Atrial fibrillation with premature ventricular or aberrantly conducted complexes  Right bundle branch block  Left anterior fascicular block  *** Bifascicular block ***  Abnormal ECG  When compared with ECG of 19-Feb-2006 08:48,  Atrial fibrillation has replaced Sinus rhythm  Vent. rate has increased BY  47 BPM  T wave inversion now evident in Anterior leads  ----------unconfirmed----------  Confirmed by OVERREAD, NOT ([id number]), editor PEARSON, BARBARA ([id number]) on 07/16/2012 12:47:43 PM   Assessment/Plan:  Assessment/Plan:  Assessment 1) melena-hemodynamically stable.  one black bm overnight.  no other sx.   2) AF-previously on pradaxa.   Plan 1) EGD today.  I have discussed the risks benefits and complications of proceedure to include not limited to bleeding infection perforation and sedation and he wishes to proceed.   Further recs to follow.   Electronic Signatures: SLoistine Simas(MD)  (Signed 19-Jun-14 13:03)  Authored: Chief Complaint,  VITAL SIGNS/ANCILLARY NOTES, Brief Assessment, Lab Results, Radiology Results, Assessment/Plan   Last Updated: 19-Jun-14 13:03 by Loistine Simas (MD)

## 2015-07-10 ENCOUNTER — Other Ambulatory Visit: Payer: Self-pay | Admitting: Urology

## 2015-07-10 DIAGNOSIS — R319 Hematuria, unspecified: Secondary | ICD-10-CM

## 2015-07-18 ENCOUNTER — Ambulatory Visit
Admission: RE | Admit: 2015-07-18 | Discharge: 2015-07-18 | Disposition: A | Discharge status: Home / Self Care | Payer: Medicare Other | Source: Ambulatory Visit | Attending: Urology | Admitting: Urology

## 2015-07-18 DIAGNOSIS — N200 Calculus of kidney: Secondary | ICD-10-CM | POA: Insufficient documentation

## 2015-07-18 DIAGNOSIS — I2510 Atherosclerotic heart disease of native coronary artery without angina pectoris: Secondary | ICD-10-CM | POA: Insufficient documentation

## 2015-07-18 DIAGNOSIS — R319 Hematuria, unspecified: Secondary | ICD-10-CM | POA: Insufficient documentation

## 2015-07-18 DIAGNOSIS — N323 Diverticulum of bladder: Secondary | ICD-10-CM | POA: Insufficient documentation

## 2015-07-18 DIAGNOSIS — N401 Enlarged prostate with lower urinary tract symptoms: Secondary | ICD-10-CM | POA: Diagnosis not present

## 2015-07-18 DIAGNOSIS — N138 Other obstructive and reflux uropathy: Secondary | ICD-10-CM | POA: Diagnosis not present

## 2015-07-18 MED ORDER — IOPAMIDOL (ISOVUE-300) INJECTION 61%
125.0000 mL | Freq: Once | INTRAVENOUS | Status: AC | PRN
  Administered 2015-07-18: 125 mL via INTRAVENOUS

## 2017-09-10 ENCOUNTER — Encounter: Payer: Self-pay | Admitting: Physical Therapy

## 2017-09-10 ENCOUNTER — Ambulatory Visit: Payer: Medicare Other | Attending: Family Medicine | Admitting: Physical Therapy

## 2017-09-10 DIAGNOSIS — R2689 Other abnormalities of gait and mobility: Secondary | ICD-10-CM | POA: Diagnosis present

## 2017-09-10 NOTE — Therapy (Signed)
[facility] [address], [place], [address] Phone: [phone]   Fax:  [phone]  Physical Therapy Treatment  Patient Details  Name: Kyle [facility]ron MRN: [id number] Date of Birth: 1920-08-08 Referring Provider: Netty Starring MD   Encounter Date: 09/10/2017  PT End of Session - 09/10/17 1553    Visit Number  1    Number of Visits  17    Date for PT Re-Evaluation  11/05/17    PT Start Time  0315    PT Stop Time  0415    PT Time Calculation (min)  60 min    Equipment Utilized During Treatment  Gait belt    Activity Tolerance  Patient tolerated treatment well    Behavior During Therapy  [facility] for tasks assessed/performed       Past Medical History:  Diagnosis Date  . Alzheimer disease   . Hypertension     History reviewed. No pertinent surgical history.  There were no vitals filed for this visit.  Subjective Assessment - 09/10/17 1529    Subjective  Balance disorder    Patient is accompained by:  Family member    Pertinent History  Patient is a 84 y/o male presenting with referral for balance and gait deficits. Patient's daughter reports a "couple" falls in the past month, and her and patient report that falls/unsteadiness occurs when patient is turning when walking. Patient reports no pain. Patient lives alone, since his wife passed 3 months ago, which he is emotional about- but has a caretaker in the am, and his daughter assists him in the pm with heavy chores, and cooking.  Patient is completing basic ADLs on his own (bathing, dressing). Daughter reports patient has alzheimer's and is not safe at this time to cook on his own.      Limitations  Lifting;Standing;Walking;House hold activities    How long can you sit comfortably?  unlimited    How long can you stand comfortably?  5 mins    How long can you walk comfortably?  5 mins    Diagnostic tests  None at this time    Patient Stated Goals  Reduce  falls, walk longer/more steady     Currently in Pain?  No/denies    Pain Score  0-No pain            ROM Hip motions restricted by soft tissue tension  (+) 90/90, elys bilat  Strength All knee/ankle strength 5/5  Hip flexion 5/5 bilat Hip ER: 4+/5 bilat Hip IR 5/5 bilat Hip ext 3+/5 bilat Hip abd 4/5 bilat  Gait: Patient ambulates with decreased hip ext bilat and decreased push off bilat.Patient with decreased step length bilat (shuffle steps). Patient with increased thoracic kyphosis and forward head. With head turns patient deviates ambulation to the side he is turning his head to, and has poor safety awareness with stopping and turning/changing directions.   BERG 44/56 DGI 11/16 TUG: 18sec 10MWT 0.53m/s                        PT Education - 09/10/17 1542    Education Details  Patient was educated on diagnosis, anatomy and pathology involved, prognosis, role of PT, and was given an HEP, demonstrating exercise with proper form following verbal and tactile cues, and was given a paper hand out to continue exercise at home. Pt was educated on and agreed to plan of care.  Person(s) Educated  Patient;Child(ren)    Methods  Explanation;Handout;Demonstration;Tactile cues;Verbal cues    Comprehension  Verbalized understanding;Returned demonstration;Verbal cues required;Tactile cues required       PT Short Term Goals - 09/10/17 2132      PT SHORT TERM GOAL #1   Title  Pt will be independent with HEP in order to improve strength and balance in order to decrease fall risk and improve function at home and work.    Time  4    Period  Weeks    Status  New        PT Long Term Goals - 09/10/17 2122      PT LONG TERM GOAL #1   Title  Pt will improve DGI by at least 3 points in order to demonstrate clinically significant improvement in balance and decreased risk for falls    Baseline  09/10/17 11/16    Time  8    Period  Weeks    Status  New      PT LONG  TERM GOAL #2   Title  Pt will improve BERG by at least 3 points in order to demonstrate clinically significant improvement in balance.    Baseline  09/10/17 44/56    Time  8    Period  Weeks    Status  New      PT LONG TERM GOAL #3   Title  Patient will demonstrate 4/5 hip ext bilat to normalize gait and improve hip strategy to reduce falls    Baseline  09/10/17 3+/5 biat hip ext    Time  8    Period  Weeks    Status  New            Plan - 09/10/17 2133    Clinical Impression Statement  Patient is a 84 year old male presenting with balance deficits secondary to abnormal gait, decreased hip ext and PF strength bilat, decreased safety awareness, and age-related changes. Impairments affecting patient's ability to ambulate safety, without falling. Patient is unable to fully participate in his ADLs without fall risk. patient will benefit from skilled PT to address these impairments to improve safety at home and return pt to PLOF    History and Personal Factors relevant to plan of care:  dementia    Clinical Presentation  Evolving    Clinical Presentation due to:  2 personal factors/comorbidities, 3 body systems/activity limitations/participation restrictions     Clinical Decision Making  Moderate    Rehab Potential  Good    Clinical Impairments Affecting Rehab Potential  (+) support system, motivation (-) age, dementia, other comorbidities, hx of falls    PT Frequency  2x / week    PT Duration  8 weeks    PT Treatment/Interventions  Passive range of motion;Neuromuscular re-education;Gait training;Dry needling;Manual techniques;Patient/family education;Balance training;Therapeutic exercise;Therapeutic activities;Moist Heat;Functional mobility training;Aquatic Therapy    PT Next Visit Plan  HEP and goal review    PT Home Exercise Plan  bridge, standing heel raises, thomas stretch    Consulted and Agree with Plan of Care  Patient;Family member/caregiver    Family Member Consulted  Daughter        Patient will benefit from skilled therapeutic intervention in order to improve the following deficits and impairments:  Abnormal gait, Increased fascial restricitons, Impaired sensation, Pain, Improper body mechanics, Postural dysfunction, Impaired tone, Increased muscle spasms, Decreased mobility, Decreased range of motion, Decreased endurance, Decreased activity tolerance, Decreased strength, Decreased balance, Difficulty walking, Impaired flexibility  Visit Diagnosis: Balance disorder  Other abnormalities of gait and mobility     Problem List There are no active problems to display for this patient.  Shelton Silvas PT, DPT Shelton Silvas 09/11/2017, 11:22 AM  [place] [facility] [address], [place], [address] Phone: [phone]   Fax:  [phone]  Name: Kyle Barron MRN: [id number] Date of Birth: 02-25-1920

## 2017-09-16 ENCOUNTER — Encounter: Payer: Self-pay | Admitting: Physical Therapy

## 2017-09-16 ENCOUNTER — Ambulatory Visit: Payer: Medicare Other | Admitting: Physical Therapy

## 2017-09-16 DIAGNOSIS — R2689 Other abnormalities of gait and mobility: Secondary | ICD-10-CM | POA: Diagnosis not present

## 2017-09-16 NOTE — Therapy (Signed)
[facility] [address] [place], [address] Phone: [phone]   Fax:  [phone]  Physical Therapy Treatment  Patient Details  Name: Kyle Barron MRN: [id number] Date of Birth: 26-Nov-1920 Referring Provider: Netty Starring MD   Encounter Date: 09/16/2017  PT End of Session - 09/16/17 1658    Visit Number  2    Number of Visits  17    Date for PT Re-Evaluation  11/05/17    PT Start Time  0445    PT Stop Time  0530    PT Time Calculation (min)  45 min    Equipment Utilized During Treatment  Gait belt    Activity Tolerance  Patient tolerated treatment well    Behavior During Therapy  [facility] for tasks assessed/performed       Past Medical History:  Diagnosis Date  . Alzheimer disease   . Hypertension     History reviewed. No pertinent surgical history.  There were no vitals filed for this visit.  Subjective Assessment - 09/16/17 1652    Subjective  Patient and daughter report that this morning he was standing up to walk to the bathroom and "stood up too fast" which mad him dizzy, and he fell backwards onto the bed. Patient reports he felt like "the room was spinning". Patient and daughter report little compliance with his HEP.     Patient is accompained by:  Family member    Pertinent History  Patient is a 84 y/o male presenting with referral for balance and gait deficits. Patient's daughter reports a "couple" falls in the past month, and her and patient report that falls/unsteadiness occurs when patient is turning when walking. Patient reports no pain. Patient lives alone, since his wife passed 3 months ago, which he is emotional about- but has a caretaker in the am, and his daughter assists him in the pm with heavy chores, and cooking.  Patient is completing basic ADLs on his own (bathing, dressing). Daughter reports patient has alzheimer's and is not safe at this time to cook on his own.      Limitations   Lifting;Standing;Walking;House hold activities    How long can you sit comfortably?  unlimited    How long can you stand comfortably?  5 mins    How long can you walk comfortably?  5 mins    Diagnostic tests  None at this time    Patient Stated Goals  Reduce falls, walk longer/more steady        Ther-Ex - Bridge 3x 10 with max cuing at first with good carry over following - Thomas stretch 30sec hold for HEP review - Heel raises 3x 10 with cuing for UE support for balance support only and eccentric control  - Side stepping on foam pad down and back x3 with CGA from PT for safety   - semi tandem walking on foam pad down and back x3 with CGA-minA from PT for safety  - Dynamic LE reaching with PT calling out colored balance stones while pt stands on 1 LE to tap stone with patinet requiring minA for safety/to maintain balance - Walking with randomly called out turns and stops w/ patient requiring CGA-minA for safety                     PT Education - 09/16/17 1657    Education Details  Exericse form    Person(s) Educated  Patient  Methods  Explanation;Demonstration;Tactile cues;Verbal cues    Comprehension  Verbalized understanding;Returned demonstration;Verbal cues required;Tactile cues required       PT Short Term Goals - 09/10/17 2132      PT SHORT TERM GOAL #1   Title  Pt will be independent with HEP in order to improve strength and balance in order to decrease fall risk and improve function at home and work.    Time  4    Period  Weeks    Status  New        PT Long Term Goals - 09/10/17 2122      PT LONG TERM GOAL #1   Title  Pt will improve DGI by at least 3 points in order to demonstrate clinically significant improvement in balance and decreased risk for falls    Baseline  09/10/17 11/16    Time  8    Period  Weeks    Status  New      PT LONG TERM GOAL #2   Title  Pt will improve BERG by at least 3 points in order to demonstrate clinically  significant improvement in balance.    Baseline  09/10/17 44/56    Time  8    Period  Weeks    Status  New      PT LONG TERM GOAL #3   Title  Patient will demonstrate 4/5 hip ext bilat to normalize gait and improve hip strategy to reduce falls    Baseline  09/10/17 3+/5 biat hip ext    Time  8    Period  Weeks    Status  New            Plan - 09/16/17 1715    Clinical Impression Statement  PT led patient through therex with LE strengthening and balance challenge. Patient requiring assisted from PT for safety with most dynamic balance challenges. Patient has good carry over between sets of cuing as well, with some difficulty with execution.     Rehab Potential  Good    Clinical Impairments Affecting Rehab Potential  (+) support system, motivation (-) age, dementia, other comorbidities, hx of falls    PT Frequency  2x / week    PT Treatment/Interventions  Passive range of motion;Neuromuscular re-education;Gait training;Dry needling;Manual techniques;Patient/family education;Balance training;Therapeutic exercise;Therapeutic activities;Moist Heat;Functional mobility training;Aquatic Therapy    PT Next Visit Plan  dynamic balance and LE strengthening.     PT Home Exercise Plan  bridge, standing heel raises, thomas stretch    Consulted and Agree with Plan of Care  Patient;Family member/caregiver    Family Member Consulted  Daughter       Patient will benefit from skilled therapeutic intervention in order to improve the following deficits and impairments:  Abnormal gait, Increased fascial restricitons, Impaired sensation, Pain, Improper body mechanics, Postural dysfunction, Impaired tone, Increased muscle spasms, Decreased mobility, Decreased range of motion, Decreased endurance, Decreased activity tolerance, Decreased strength, Decreased balance, Difficulty walking, Impaired flexibility  Visit Diagnosis: Other abnormalities of gait and mobility     Problem List There are no active  problems to display for this patient.  Shelton Silvas PT, DPT Shelton Silvas 09/16/2017, 5:34 PM  [place] [facility] [address], [place], [address] Phone: [phone]   Fax:  [phone]  Name: IANMICHAEL AMESCUA MRN: [id number] Date of Birth: [birth year]

## 2017-09-18 ENCOUNTER — Encounter: Payer: Self-pay | Admitting: Physical Therapy

## 2017-09-18 ENCOUNTER — Ambulatory Visit: Payer: Medicare Other | Admitting: Physical Therapy

## 2017-09-18 DIAGNOSIS — R2689 Other abnormalities of gait and mobility: Secondary | ICD-10-CM

## 2017-09-18 NOTE — Therapy (Signed)
[facility] [address], [place], [address] Phone: [phone]   Fax:  [phone]  Physical Therapy Treatment  Patient Details  Name: Kyle Barron MRN: [id number] Date of Birth: [birth year] Referring Provider: Netty Starring MD   Encounter Date: 09/18/2017  PT End of Session - 09/18/17 1047    Visit Number  3    Number of Visits  17    Date for PT Re-Evaluation  11/05/17    PT Start Time  1030    PT Stop Time  1115    PT Time Calculation (min)  45 min    Equipment Utilized During Treatment  Gait belt    Activity Tolerance  Patient tolerated treatment well    Behavior During Therapy  [facility] for tasks assessed/performed       Past Medical History:  Diagnosis Date  . Alzheimer disease   . Hypertension     History reviewed. No pertinent surgical history.  There were no vitals filed for this visit.  Subjective Assessment - 09/18/17 1042    Subjective  Patient reports no pain or falls since last visit. Patient reports "some compliance with his HEP". No questions or concerns    Patient is accompained by:  Family member    Pertinent History  Patient is a 84 y/o male presenting with referral for balance and gait deficits. Patient's daughter reports a "couple" falls in the past month, and her and patient report that falls/unsteadiness occurs when patient is turning when walking. Patient reports no pain. Patient lives alone, since his wife passed 3 months ago, which he is emotional about- but has a caretaker in the am, and his daughter assists him in the pm with heavy chores, and cooking.  Patient is completing basic ADLs on his own (bathing, dressing). Daughter reports patient has alzheimer's and is not safe at this time to cook on his own.      Limitations  Lifting;Standing;Walking;House hold activities    How long can you sit comfortably?  unlimited    How long can you stand comfortably?  5 mins    How long  can you walk comfortably?  5 mins    Diagnostic tests  None at this time    Patient Stated Goals  Reduce falls, walk longer/more steady            Ther-Ex - Nustep 4 min L2 for increased quad demand - Side stepping with red tband and gait belt with CGA for safety and consistent cuing to "clear feet" - Mini squat at treadmill bar 3x 10 with 50% carry overood posture throughout from last session with max cuing for full hip ext and g - Side stepping on foam pad down and back x3 with CGA from PT for safety   - semi tandem walking on foam pad down and back x3 with CGA-minA from PT for safety    Gait Training - Gait training with obstacle negotiation (stepping over and around obstacles) and with head turns. Patient requiring CGA with obstacle negotation with decreased gait speed when approaching obstacles. Patient veering to side he turns head to, needing CGA for safety.                   PT Education - 09/18/17 1046    Education Details  Exercise form    Person(s) Educated  Patient    Methods  Explanation;Demonstration;Verbal cues    Comprehension  Verbalized understanding;Returned demonstration;Verbal cues required  PT Short Term Goals - 09/10/17 2132      PT SHORT TERM GOAL #1   Title  Pt will be independent with HEP in order to improve strength and balance in order to decrease fall risk and improve function at home and work.    Time  4    Period  Weeks    Status  New        PT Long Term Goals - 09/10/17 2122      PT LONG TERM GOAL #1   Title  Pt will improve DGI by at least 3 points in order to demonstrate clinically significant improvement in balance and decreased risk for falls    Baseline  09/10/17 11/16    Time  8    Period  Weeks    Status  New      PT LONG TERM GOAL #2   Title  Pt will improve BERG by at least 3 points in order to demonstrate clinically significant improvement in balance.    Baseline  09/10/17 44/56    Time  8    Period  Weeks     Status  New      PT LONG TERM GOAL #3   Title  Patient will demonstrate 4/5 hip ext bilat to normalize gait and improve hip strategy to reduce falls    Baseline  09/10/17 3+/5 biat hip ext    Time  8    Period  Weeks    Status  New            Plan - 09/18/17 1154    Clinical Impression Statement  PT continued to led patient through dynamic balance activitys and LE strengthening. Patient requires cuing for proper posture and full hip ext during therex and gait. Patient with decreased balance with CGA for dynamic gait activities to prevent LOB. PT will continue to increase LE strength with dynamic balance carry over as able.     Rehab Potential  Good    Clinical Impairments Affecting Rehab Potential  (+) support system, motivation (-) age, dementia, other comorbidities, hx of falls    PT Frequency  2x / week    PT Duration  8 weeks    PT Treatment/Interventions  Passive range of motion;Neuromuscular re-education;Gait training;Dry needling;Manual techniques;Patient/family education;Balance training;Therapeutic exercise;Therapeutic activities;Moist Heat;Functional mobility training;Aquatic Therapy    PT Next Visit Plan  dynamic balance and LE strengthening.     PT Home Exercise Plan  bridge, standing heel raises, thomas stretch    Consulted and Agree with Plan of Care  Patient;Family member/caregiver    Family Member Consulted  Daughter       Patient will benefit from skilled therapeutic intervention in order to improve the following deficits and impairments:  Abnormal gait, Increased fascial restricitons, Impaired sensation, Pain, Improper body mechanics, Postural dysfunction, Impaired tone, Increased muscle spasms, Decreased mobility, Decreased range of motion, Decreased endurance, Decreased activity tolerance, Decreased strength, Decreased balance, Difficulty walking, Impaired flexibility  Visit Diagnosis: Other abnormalities of gait and mobility  Balance  disorder     Problem List There are no active problems to display for this patient.  Shelton Silvas PT, DPT Shelton Silvas 09/18/2017, 11:58 AM  [place] [facility] [address] [place], [address] Phone: [phone]   Fax:  [phone]  Name: Kyle Barron MRN: [id number] Date of Birth: 1920-08-16

## 2017-09-23 ENCOUNTER — Encounter: Payer: Self-pay | Admitting: Physical Therapy

## 2017-09-23 ENCOUNTER — Ambulatory Visit: Payer: Medicare Other | Admitting: Physical Therapy

## 2017-09-23 DIAGNOSIS — R2689 Other abnormalities of gait and mobility: Secondary | ICD-10-CM

## 2017-09-23 NOTE — Therapy (Signed)
[facility] [address] [place], [address] Phone: [phone]   Fax:  [phone]  Physical Therapy Treatment  Patient Details  Name: Kyle Barron MRN: [id number] Date of Birth: 07-12-1920 Referring Provider: Netty Starring MD   Encounter Date: 09/23/2017  PT End of Session - 09/23/17 1357    Visit Number  4    Number of Visits  17    Date for PT Re-Evaluation  11/05/17    PT Start Time  0145    PT Stop Time  0230    PT Time Calculation (min)  45 min    Equipment Utilized During Treatment  Gait belt    Activity Tolerance  Patient tolerated treatment well    Behavior During Therapy  [facility] for tasks assessed/performed       Past Medical History:  Diagnosis Date  . Alzheimer disease   . Hypertension     History reviewed. No pertinent surgical history.  There were no vitals filed for this visit.  Subjective Assessment - 09/23/17 1355    Subjective  Patient reports no pain or falls since last visit. Patient reports compliance with his HEP. No questions or concerns    Pertinent History  Patient is a 84 y/o male presenting with referral for balance and gait deficits. Patient's daughter reports a "couple" falls in the past month, and her and patient report that falls/unsteadiness occurs when patient is turning when walking. Patient reports no pain. Patient lives alone, since his wife passed 3 months ago, which he is emotional about- but has a caretaker in the am, and his daughter assists him in the pm with heavy chores, and cooking.  Patient is completing basic ADLs on his own (bathing, dressing). Daughter reports patient has alzheimer's and is not safe at this time to cook on his own.      Limitations  Lifting;Standing;Walking;House hold activities    How long can you sit comfortably?  unlimited    How long can you stand comfortably?  5 mins    How long can you walk comfortably?  5 mins    Diagnostic tests   None at this time    Patient Stated Goals  Reduce falls, walk longer/more steady           Ther-Ex - Bike 5 min L2 with patient reporting some LE fatigue by the end - Mini squat at treadmill bar 3x 10 with consistent cuing for proper form with good carry over by the last set - trials of 4 box step with min cuing for patient to fully clear feet with good carry over - Stepping forward and backward over cane 4in from floor x8 with patient utilizing hip/knee flex for balance and PT CGA. With backward patient uses hip rotation to assist in foot clearance - Sidestepping over cane 4in from floor x8 (R+L = 1 rep) with minA from PT to maintain LOB - Step up with LLE leading and RLE leading 3x 8 each with bilat handrail support for balance                    PT Education - 09/23/17 1357    Education Details  Exercise form    Person(s) Educated  Patient    Methods  Explanation;Verbal cues;Demonstration    Comprehension  Verbalized understanding;Verbal cues required;Returned demonstration       PT Short Term Goals - 09/10/17 2132      PT SHORT TERM  GOAL #1   Title  Pt will be independent with HEP in order to improve strength and balance in order to decrease fall risk and improve function at home and work.    Time  4    Period  Weeks    Status  New        PT Long Term Goals - 09/10/17 2122      PT LONG TERM GOAL #1   Title  Pt will improve DGI by at least 3 points in order to demonstrate clinically significant improvement in balance and decreased risk for falls    Baseline  09/10/17 11/16    Time  8    Period  Weeks    Status  New      PT LONG TERM GOAL #2   Title  Pt will improve BERG by at least 3 points in order to demonstrate clinically significant improvement in balance.    Baseline  09/10/17 44/56    Time  8    Period  Weeks    Status  New      PT LONG TERM GOAL #3   Title  Patient will demonstrate 4/5 hip ext bilat to normalize gait and improve hip  strategy to reduce falls    Baseline  09/10/17 3+/5 biat hip ext    Time  8    Period  Weeks    Status  New            Plan - 09/23/17 1430    Clinical Impression Statement  PT continued to lead patient through dynamic balance and strengthening in a safe environment where PT can provide assistance if needed to maintain balance. Patient required rest between all therex, but is able to complete all therex following seated rest breaks.     Rehab Potential  Good    Clinical Impairments Affecting Rehab Potential  (+) support system, motivation (-) age, dementia, other comorbidities, hx of falls    PT Frequency  2x / week    PT Duration  8 weeks    PT Treatment/Interventions  Passive range of motion;Neuromuscular re-education;Gait training;Dry needling;Manual techniques;Patient/family education;Balance training;Therapeutic exercise;Therapeutic activities;Moist Heat;Functional mobility training;Aquatic Therapy    PT Next Visit Plan  dynamic balance and LE strengthening.     PT Home Exercise Plan  bridge, standing heel raises, thomas stretch    Consulted and Agree with Plan of Care  Patient;Family member/caregiver    Family Member Consulted  Daughter       Patient will benefit from skilled therapeutic intervention in order to improve the following deficits and impairments:  Abnormal gait, Increased fascial restricitons, Impaired sensation, Pain, Improper body mechanics, Postural dysfunction, Impaired tone, Increased muscle spasms, Decreased mobility, Decreased range of motion, Decreased endurance, Decreased activity tolerance, Decreased strength, Decreased balance, Difficulty walking, Impaired flexibility  Visit Diagnosis: Other abnormalities of gait and mobility  Balance disorder     Problem List There are no active problems to display for this patient.  Shelton Silvas PT, DPTm  Shelton Silvas 09/23/2017, 3:55 PM  [facility] [address], [place], [address] Phone: [phone]   Fax:  [phone]  Name: Kyle Barron MRN: [id number] Date of Birth: 08-07-1920

## 2017-09-25 ENCOUNTER — Ambulatory Visit: Payer: Medicare Other | Admitting: Physical Therapy

## 2017-09-25 ENCOUNTER — Encounter: Payer: Self-pay | Admitting: Physical Therapy

## 2017-09-25 DIAGNOSIS — R2689 Other abnormalities of gait and mobility: Secondary | ICD-10-CM | POA: Diagnosis not present

## 2017-09-25 NOTE — Therapy (Signed)
[facility] [address] [place], [address] Phone: [phone]   Fax:  [phone]  Physical Therapy Treatment  Patient Details  Name: Kyle Barron MRN: [id number] Date of Birth: 1920-12-18 Referring Provider: Netty Starring MD   Encounter Date: 09/25/2017  PT End of Session - 09/25/17 1435    Visit Number  5    Number of Visits  17    Date for PT Re-Evaluation  11/05/17    PT Start Time  0230    PT Stop Time  0315    PT Time Calculation (min)  45 min    Equipment Utilized During Treatment  Gait belt    Activity Tolerance  Patient tolerated treatment well    Behavior During Therapy  [facility] for tasks assessed/performed       Past Medical History:  Diagnosis Date  . Alzheimer disease   . Hypertension     History reviewed. No pertinent surgical history.  There were no vitals filed for this visit.  Subjective Assessment - 09/25/17 1434    Subjective  Patient reports no pain or falls since last visit. Patient reports some soreness following PT session. Patient reports HEP compliance with no questions or concerns.     Patient is accompained by:  Family member    Pertinent History  Patient is a 84 y/o male presenting with referral for balance and gait deficits. Patient's daughter reports a "couple" falls in the past month, and her and patient report that falls/unsteadiness occurs when patient is turning when walking. Patient reports no pain. Patient lives alone, since his wife passed 3 months ago, which he is emotional about- but has a caretaker in the am, and his daughter assists him in the pm with heavy chores, and cooking.  Patient is completing basic ADLs on his own (bathing, dressing). Daughter reports patient has alzheimer's and is not safe at this time to cook on his own.      Limitations  Lifting;Standing;Walking;House hold activities    How long can you sit comfortably?  unlimited    How long can you  stand comfortably?  5 mins    How long can you walk comfortably?  5 mins    Diagnostic tests  None at this time    Patient Stated Goals  Reduce falls, walk longer/more steady          Ther-Ex - Bike 5 min L3 with patient reporting some LE fatigue by the end - Trials of soccer ball passes and stops to challenge patients dynamic balance and SLS, with patient able to maintain balance with increased postural sway  - Mini squat at treadmill bar with airex pad 3x 10 with moderate carry over from prior session - Sidestepping with black sports cord resistance 3x; 3 rounds of stepping out and back 4 steps with cuing for eccentric control and occasional minA to maintain balance and safety - Step ups onto 4in step 3x 12 with cuing for control with lower                     PT Education - 09/25/17 1434    Education Details  exercise form     Person(s) Educated  Patient    Methods  Explanation;Demonstration;Verbal cues    Comprehension  Verbalized understanding;Returned demonstration;Verbal cues required       PT Short Term Goals - 09/10/17 2132      PT SHORT TERM GOAL #1  Title  Pt will be independent with HEP in order to improve strength and balance in order to decrease fall risk and improve function at home and work.    Time  4    Period  Weeks    Status  New        PT Long Term Goals - 09/10/17 2122      PT LONG TERM GOAL #1   Title  Pt will improve DGI by at least 3 points in order to demonstrate clinically significant improvement in balance and decreased risk for falls    Baseline  09/10/17 11/16    Time  8    Period  Weeks    Status  New      PT LONG TERM GOAL #2   Title  Pt will improve BERG by at least 3 points in order to demonstrate clinically significant improvement in balance.    Baseline  09/10/17 44/56    Time  8    Period  Weeks    Status  New      PT LONG TERM GOAL #3   Title  Patient will demonstrate 4/5 hip ext bilat to normalize gait and  improve hip strategy to reduce falls    Baseline  09/10/17 3+/5 biat hip ext    Time  8    Period  Weeks    Status  New            Plan - 09/25/17 1511    Clinical Impression Statement  PT continued to challenge patient's balance in dynamic environment with patient requiring occassional assistance to prevent LOB, but also good hip/ankle/step strategies. Patient requires cuing for accuracy of all therex, which he is able to comply with for the most part. Patient requires continual cuing throughout session for proper posture.     Rehab Potential  Good    Clinical Impairments Affecting Rehab Potential  (+) support system, motivation (-) age, dementia, other comorbidities, hx of falls    PT Frequency  2x / week    PT Duration  8 weeks    PT Treatment/Interventions  Passive range of motion;Neuromuscular re-education;Gait training;Dry needling;Manual techniques;Patient/family education;Balance training;Therapeutic exercise;Therapeutic activities;Moist Heat;Functional mobility training;Aquatic Therapy    PT Next Visit Plan  dynamic balance and LE strengthening.     PT Home Exercise Plan  bridge, standing heel raises, thomas stretch    Consulted and Agree with Plan of Care  Patient    Family Member Consulted  Daughter       Patient will benefit from skilled therapeutic intervention in order to improve the following deficits and impairments:  Abnormal gait, Increased fascial restricitons, Impaired sensation, Pain, Improper body mechanics, Postural dysfunction, Impaired tone, Increased muscle spasms, Decreased mobility, Decreased range of motion, Decreased endurance, Decreased activity tolerance, Decreased strength, Decreased balance, Difficulty walking, Impaired flexibility  Visit Diagnosis: Other abnormalities of gait and mobility  Balance disorder     Problem List There are no active problems to display for this patient.  Shelton Silvas PT, DPT Shelton Silvas 09/25/2017, 4:22  PM  [facility] [address], [place], [address] Phone: [phone]   Fax:  [phone]  Name: Kyle Barron MRN: [id number] Date of Birth: 1920/02/24

## 2017-10-01 ENCOUNTER — Ambulatory Visit: Payer: Medicare Other | Admitting: Physical Therapy

## 2017-10-02 ENCOUNTER — Ambulatory Visit: Payer: Medicare Other | Attending: Family Medicine | Admitting: Physical Therapy

## 2017-10-02 ENCOUNTER — Encounter: Payer: Self-pay | Admitting: Physical Therapy

## 2017-10-02 DIAGNOSIS — R2689 Other abnormalities of gait and mobility: Secondary | ICD-10-CM | POA: Diagnosis present

## 2017-10-02 NOTE — Therapy (Signed)
[facility] [address] [place], [address] Phone: [phone]   Fax:  [phone]  Physical Therapy Treatment  Patient Details  Name: Kyle Barron MRN: [id number] Date of Birth: September 16, 1920 Referring Provider: Netty Starring MD   Encounter Date: 10/02/2017  PT End of Session - 10/02/17 0955    Visit Number  6    Number of Visits  17    PT Start Time  0945    PT Stop Time  1030    PT Time Calculation (min)  45 min    Equipment Utilized During Treatment  Gait belt    Activity Tolerance  Patient tolerated treatment well    Behavior During Therapy  [facility] for tasks assessed/performed       Past Medical History:  Diagnosis Date  . Alzheimer disease   . Hypertension     History reviewed. No pertinent surgical history.  There were no vitals filed for this visit.  Subjective Assessment - 10/02/17 0952    Subjective  Patient reports no pain or falls since last visit. Reports some compliance with HEP with no questions or concerns.     Patient is accompained by:  Family member    Pertinent History  Patient is a 84 y/o male presenting with referral for balance and gait deficits. Patient's daughter reports a "couple" falls in the past month, and her and patient report that falls/unsteadiness occurs when patient is turning when walking. Patient reports no pain. Patient lives alone, since his wife passed 3 months ago, which he is emotional about- but has a caretaker in the am, and his daughter assists him in the pm with heavy chores, and cooking.  Patient is completing basic ADLs on his own (bathing, dressing). Daughter reports patient has alzheimer's and is not safe at this time to cook on his own.      Limitations  Lifting;Standing;Walking;House hold activities    How long can you sit comfortably?  unlimited    How long can you stand comfortably?  5 mins    How long can you walk comfortably?  5 mins    Diagnostic  tests  None at this time    Patient Stated Goals  Reduce falls, walk longer/more steady          Ther-Ex - Nustep L3 15mins  - Mini squat 3x 12 with seated rest break between sets with consistent cuing for proper form to "keep weight on heels" - Standing hip ext with yellow tband 3x 10 with demo and max TC for proper form with 75% carry over - Seated ankle DF against therapist manual resistance (for eccentric and concentric component) 2x 12  - Standing heel raises 3x 12 with demo and max cuing initially for proper form with good carry over following  - Sidestepping with black sports cord resistance 3x; 3 rounds of stepping out and back 3 steps with cuing for eccentric control and occasional minA to maintain balance and safety                   PT Education - 10/02/17 0954    Education Details  Exercise form    Person(s) Educated  Patient    Methods  Explanation;Demonstration;Verbal cues;Tactile cues    Comprehension  Verbalized understanding;Returned demonstration;Verbal cues required;Tactile cues required       PT Short Term Goals - 09/10/17 2132      PT SHORT TERM GOAL #1   Title  Pt will be independent with HEP in order to improve strength and balance in order to decrease fall risk and improve function at home and work.    Time  4    Period  Weeks    Status  New        PT Long Term Goals - 09/10/17 2122      PT LONG TERM GOAL #1   Title  Pt will improve DGI by at least 3 points in order to demonstrate clinically significant improvement in balance and decreased risk for falls    Baseline  09/10/17 11/16    Time  8    Period  Weeks    Status  New      PT LONG TERM GOAL #2   Title  Pt will improve BERG by at least 3 points in order to demonstrate clinically significant improvement in balance.    Baseline  09/10/17 44/56    Time  8    Period  Weeks    Status  New      PT LONG TERM GOAL #3   Title  Patient will demonstrate 4/5 hip ext bilat to normalize  gait and improve hip strategy to reduce falls    Baseline  09/10/17 3+/5 biat hip ext    Time  8    Period  Weeks    Status  New            Plan - 10/02/17 1033    Clinical Impression Statement  PT led patient through therex for LE strengthening this session to ensure LE stability to improve balance and gait to prevent falls. Patient was able to complete therex correctly with TC and VC from PT. Patient is demonstrating good carry over of familiar exercises between sessions.     Rehab Potential  Good    Clinical Impairments Affecting Rehab Potential  (+) support system, motivation (-) age, dementia, other comorbidities, hx of falls    PT Frequency  2x / week    PT Duration  8 weeks    PT Treatment/Interventions  Passive range of motion;Neuromuscular re-education;Gait training;Dry needling;Manual techniques;Patient/family education;Balance training;Therapeutic exercise;Therapeutic activities;Moist Heat;Functional mobility training;Aquatic Therapy    PT Next Visit Plan  dynamic balance and LE strengthening.     PT Home Exercise Plan  bridge, standing heel raises, thomas stretch    Consulted and Agree with Plan of Care  Patient    Family Member Consulted  Daughter       Patient will benefit from skilled therapeutic intervention in order to improve the following deficits and impairments:  Abnormal gait, Increased fascial restricitons, Impaired sensation, Pain, Improper body mechanics, Postural dysfunction, Impaired tone, Increased muscle spasms, Decreased mobility, Decreased range of motion, Decreased endurance, Decreased activity tolerance, Decreased strength, Decreased balance, Difficulty walking, Impaired flexibility  Visit Diagnosis: Other abnormalities of gait and mobility     Problem List There are no active problems to display for this patient.  Shelton Silvas PT, DPT Shelton Silvas 10/02/2017, 10:47 AM  [facility] [address] [place], [address] Phone: [phone]   Fax:  [phone]  Name: Kyle Barron MRN: [id number] Date of Birth: 07-16-1920

## 2017-10-07 ENCOUNTER — Ambulatory Visit: Payer: Medicare Other | Admitting: Physical Therapy

## 2017-10-10 ENCOUNTER — Ambulatory Visit: Payer: Medicare Other | Admitting: Physical Therapy

## 2017-10-10 ENCOUNTER — Encounter: Payer: Medicare Other | Admitting: Physical Therapy

## 2017-10-10 ENCOUNTER — Encounter: Payer: Self-pay | Admitting: Physical Therapy

## 2017-10-10 DIAGNOSIS — R2689 Other abnormalities of gait and mobility: Secondary | ICD-10-CM | POA: Diagnosis not present

## 2017-10-10 NOTE — Therapy (Signed)
[facility] [address] [place], [address] Phone: [phone]   Fax:  [phone]  Physical Therapy Treatment  Patient Details  Name: Kyle Barron MRN: [id number] Date of Birth: 1920/10/26 Referring Provider: Netty Starring MD   Encounter Date: 10/10/2017  PT End of Session - 10/10/17 1010    Visit Number  7    Number of Visits  17    Date for PT Re-Evaluation  11/05/17    PT Start Time  1005    PT Stop Time  1045    PT Time Calculation (min)  40 min    Equipment Utilized During Treatment  Gait belt    Activity Tolerance  Patient tolerated treatment well    Behavior During Therapy  [facility] for tasks assessed/performed       Past Medical History:  Diagnosis Date  . Alzheimer disease   . Hypertension     History reviewed. No pertinent surgical history.  There were no vitals filed for this visit.  Subjective Assessment - 10/10/17 1009    Subjective  Patient reports no falls since last visit, just that he feels a "little unsteady sometimes". Patient continues to report no pain, just that his "legs get tired". Patient reports compliance with HEP with no questions or concerns.     Patient is accompained by:  Family member    Pertinent History  Patient is a 84 y/o male presenting with referral for balance and gait deficits. Patient's daughter reports a "couple" falls in the past month, and her and patient report that falls/unsteadiness occurs when patient is turning when walking. Patient reports no pain. Patient lives alone, since his wife passed 3 months ago, which he is emotional about- but has a caretaker in the am, and his daughter assists him in the pm with heavy chores, and cooking.  Patient is completing basic ADLs on his own (bathing, dressing). Daughter reports patient has alzheimer's and is not safe at this time to cook on his own.      Limitations  Lifting;Standing;Walking;House hold activities    How long can you sit comfortably?  unlimited    How long can you stand comfortably?  5 mins    How long can you walk comfortably?  5 mins    Diagnostic tests  None at this time    Patient Stated Goals  Reduce falls, walk longer/more steady           Ther-Ex - Nustep L3 33mins  - Trials of soccer ball passes and stops to challenge patients dynamic balance and SLS, with patient able to maintain balance with increased postural sway  - Trails of sidestepping on airex pad over 76ft (down and back) at a time. Patient requires cuing and minA at times from PT to prevent LOB  - Sidestepping over cane 4in from floor x8/9/10 (R+L = 1 rep) with CGA-minA from PT to maintain LOB                     PT Education - 10/10/17 1010    Education Details  Exercise form    Person(s) Educated  Patient    Methods  Explanation;Demonstration;Verbal cues    Comprehension  Verbalized understanding;Returned demonstration;Verbal cues required       PT Short Term Goals - 09/10/17 2132      PT SHORT TERM GOAL #1   Title  Pt will be independent with HEP in order to improve strength  and balance in order to decrease fall risk and improve function at home and work.    Time  4    Period  Weeks    Status  New        PT Long Term Goals - 09/10/17 2122      PT LONG TERM GOAL #1   Title  Pt will improve DGI by at least 3 points in order to demonstrate clinically significant improvement in balance and decreased risk for falls    Baseline  09/10/17 11/16    Time  8    Period  Weeks    Status  New      PT LONG TERM GOAL #2   Title  Pt will improve BERG by at least 3 points in order to demonstrate clinically significant improvement in balance.    Baseline  09/10/17 44/56    Time  8    Period  Weeks    Status  New      PT LONG TERM GOAL #3   Title  Patient will demonstrate 4/5 hip ext bilat to normalize gait and improve hip strategy to reduce falls    Baseline  09/10/17 3+/5 biat hip ext     Time  8    Period  Weeks    Status  New            Plan - 10/10/17 1024    Clinical Impression Statement  PT continued to led patient through therex for LE strengthening and balance, which patient is able to complete with accuracy following PT demo and cuing. Patient is requiring some assistance to maintain balance with dynamic tasks, but with CGA-minA is able to maintain upright position. PT will continue balance/therex progression as able.     Rehab Potential  Good    Clinical Impairments Affecting Rehab Potential  (+) support system, motivation (-) age, dementia, other comorbidities, hx of falls    PT Frequency  2x / week    PT Duration  8 weeks    PT Treatment/Interventions  Passive range of motion;Neuromuscular re-education;Gait training;Dry needling;Manual techniques;Patient/family education;Balance training;Therapeutic exercise;Therapeutic activities;Moist Heat;Functional mobility training;Aquatic Therapy    PT Next Visit Plan  dynamic balance and LE strengthening.     PT Home Exercise Plan  bridge, standing heel raises, thomas stretch    Consulted and Agree with Plan of Care  Patient    Family Member Consulted  Daughter       Patient will benefit from skilled therapeutic intervention in order to improve the following deficits and impairments:  Abnormal gait, Increased fascial restricitons, Impaired sensation, Pain, Improper body mechanics, Postural dysfunction, Impaired tone, Increased muscle spasms, Decreased mobility, Decreased range of motion, Decreased endurance, Decreased activity tolerance, Decreased strength, Decreased balance, Difficulty walking, Impaired flexibility  Visit Diagnosis: Other abnormalities of gait and mobility  Balance disorder     Problem List There are no active problems to display for this patient.  Shelton Silvas PT, DPT Shelton Silvas 10/10/2017, 11:17 AM  [place] [address] [place], [address] Phone: [phone]   Fax:  [phone]  Name: Kyle Barron MRN: [id number] Date of Birth: May 25, 1920

## 2017-10-13 ENCOUNTER — Ambulatory Visit: Payer: Medicare Other | Admitting: Physical Therapy

## 2017-10-13 ENCOUNTER — Encounter: Payer: Self-pay | Admitting: Physical Therapy

## 2017-10-13 DIAGNOSIS — R2689 Other abnormalities of gait and mobility: Secondary | ICD-10-CM | POA: Diagnosis not present

## 2017-10-13 NOTE — Therapy (Signed)
[facility] [address] [place], [address] Phone: [phone]   Fax:  [phone]  Physical Therapy Treatment  Patient Details  Name: Kyle Barron MRN: [id number] Date of Birth: Jul 07, 1920 Referring Provider: Netty Starring MD   Encounter Date: 10/13/2017  PT End of Session - 10/13/17 1524    Visit Number  8    Number of Visits  17    Date for PT Re-Evaluation  11/05/17    PT Start Time  0315    PT Stop Time  0400    PT Time Calculation (min)  45 min    Activity Tolerance  Patient tolerated treatment well    Behavior During Therapy  [facility] for tasks assessed/performed       Past Medical History:  Diagnosis Date  . Alzheimer disease   . Hypertension     History reviewed. No pertinent surgical history.  There were no vitals filed for this visit.  Subjective Assessment - 10/13/17 1522    Subjective  Patient reports no falls since last visit. Patient reports some muscle soreness after Friday's visit that went away after "a while". Patient reports compliance with HEP with no questions or concerns.     Patient is accompained by:  Family member    Pertinent History  Patient is a 84 y/o male presenting with referral for balance and gait deficits. Patient's daughter reports a "couple" falls in the past month, and her and patient report that falls/unsteadiness occurs when patient is turning when walking. Patient reports no pain. Patient lives alone, since his wife passed 3 months ago, which he is emotional about- but has a caretaker in the am, and his daughter assists him in the pm with heavy chores, and cooking.  Patient is completing basic ADLs on his own (bathing, dressing). Daughter reports patient has alzheimer's and is not safe at this time to cook on his own.      Limitations  Lifting;Standing;Walking;House hold activities    How long can you sit comfortably?  unlimited    How long can you stand  comfortably?  5 mins    How long can you walk comfortably?  5 mins    Diagnostic tests  None at this time    Patient Stated Goals  Reduce falls, walk longer/more steady           Ther-Ex - Nustep L3 14mins; L4 51mins  - STS w/ unilateral UE support 3x 10 with seated rest break between sets with consistent cuing to "stand up tall" with full hip ext - Standing hip ext with red tband 3x 10 with demo and max TC for proper form with 75% carry over - Trials of ball toss with PT tossing ball outside pt BOS for increased reach, with patient standing on airex pad for balance challenge; cuing throughout for proper posture - MATRIX hip abd 10# 3x 10 with cuing for posture and eccentric control with nearly 100% carry over                    PT Education - 10/13/17 1524    Education Details  Exercise form    Person(s) Educated  Patient    Methods  Explanation;Verbal cues    Comprehension  Verbalized understanding;Verbal cues required       PT Short Term Goals - 09/10/17 2132      PT SHORT TERM GOAL #1   Title  Pt will be independent with  HEP in order to improve strength and balance in order to decrease fall risk and improve function at home and work.    Time  4    Period  Weeks    Status  New        PT Long Term Goals - 09/10/17 2122      PT LONG TERM GOAL #1   Title  Pt will improve DGI by at least 3 points in order to demonstrate clinically significant improvement in balance and decreased risk for falls    Baseline  09/10/17 11/16    Time  8    Period  Weeks    Status  New      PT LONG TERM GOAL #2   Title  Pt will improve BERG by at least 3 points in order to demonstrate clinically significant improvement in balance.    Baseline  09/10/17 44/56    Time  8    Period  Weeks    Status  New      PT LONG TERM GOAL #3   Title  Patient will demonstrate 4/5 hip ext bilat to normalize gait and improve hip strategy to reduce falls    Baseline  09/10/17 3+/5 biat hip ext     Time  8    Period  Weeks    Status  New            Plan - 10/13/17 1544    Clinical Impression Statement  PT continued to progress therex in wt bearing, which patient is able to complete with accuracy following PT cuing. Patient is requiring cuing for full hip ext and posture with most therex, but is able to comply better throughout sessions. Patient is also able to complete reps of STS with unilateral support; which is an improvement.     Rehab Potential  Good    Clinical Impairments Affecting Rehab Potential  (+) support system, motivation (-) age, dementia, other comorbidities, hx of falls    PT Frequency  2x / week    PT Duration  8 weeks    PT Treatment/Interventions  Passive range of motion;Neuromuscular re-education;Gait training;Dry needling;Manual techniques;Patient/family education;Balance training;Therapeutic exercise;Therapeutic activities;Moist Heat;Functional mobility training;Aquatic Therapy    PT Next Visit Plan  dynamic balance and LE strengthening.     PT Home Exercise Plan  bridge, standing heel raises, thomas stretch    Consulted and Agree with Plan of Care  Patient    Family Member Consulted  Daughter       Patient will benefit from skilled therapeutic intervention in order to improve the following deficits and impairments:  Abnormal gait, Increased fascial restricitons, Impaired sensation, Pain, Improper body mechanics, Postural dysfunction, Impaired tone, Increased muscle spasms, Decreased mobility, Decreased range of motion, Decreased endurance, Decreased activity tolerance, Decreased strength, Decreased balance, Difficulty walking, Impaired flexibility  Visit Diagnosis: Other abnormalities of gait and mobility     Problem List There are no active problems to display for this patient.  Shelton Silvas PT, DPT Shelton Silvas 10/13/2017, 4:10 PM  [facility] [address],  [place], [address] Phone: [phone]   Fax:  [phone]  Name: Kyle Barron MRN: [id number] Date of Birth: 1920/06/29

## 2017-10-15 ENCOUNTER — Encounter: Payer: Medicare Other | Admitting: Physical Therapy

## 2017-10-15 ENCOUNTER — Ambulatory Visit: Payer: Medicare Other | Admitting: Physical Therapy

## 2017-10-17 ENCOUNTER — Ambulatory Visit: Payer: Medicare Other | Admitting: Physical Therapy

## 2017-10-17 ENCOUNTER — Encounter: Payer: Self-pay | Admitting: Physical Therapy

## 2017-10-17 DIAGNOSIS — R2689 Other abnormalities of gait and mobility: Secondary | ICD-10-CM

## 2017-10-17 NOTE — Therapy (Signed)
[facility] [address], [place], [address] Phone: [phone]   Fax:  [phone]  Physical Therapy Treatment  Patient Details  Name: Kyle Barron MRN: [id number] Date of Birth: [birth year] Referring Provider: Netty Starring MD   Encounter Date: 10/17/2017  PT End of Session - 10/17/17 1007    Visit Number  9    Number of Visits  17    Date for PT Re-Evaluation  11/05/17    PT Start Time  1000    PT Stop Time  1040    PT Time Calculation (min)  40 min    Equipment Utilized During Treatment  Gait belt    Activity Tolerance  Patient tolerated treatment well    Behavior During Therapy  [facility] for tasks assessed/performed       Past Medical History:  Diagnosis Date  . Alzheimer disease   . Hypertension     History reviewed. No pertinent surgical history.  There were no vitals filed for this visit.  Subjective Assessment - 10/17/17 1005    Subjective  Patient reports no falls since last visit. Patient reports some compliance with his HEP.     Patient is accompained by:  Family member    Pertinent History  Patient is a 84 y/o male presenting with referral for balance and gait deficits. Patient's daughter reports a "couple" falls in the past month, and her and patient report that falls/unsteadiness occurs when patient is turning when walking. Patient reports no pain. Patient lives alone, since his wife passed 3 months ago, which he is emotional about- but has a caretaker in the am, and his daughter assists him in the pm with heavy chores, and cooking.  Patient is completing basic ADLs on his own (bathing, dressing). Daughter reports patient has alzheimer's and is not safe at this time to cook on his own.      Limitations  Lifting;Standing;Walking;House hold activities    How long can you sit comfortably?  unlimited    How long can you stand comfortably?  5 mins    How long can you walk comfortably?  5 mins     Diagnostic tests  None at this time    Patient Stated Goals  Reduce falls, walk longer/more steady        Ther-Ex - Nustep L3 47mins; L4 95mins  - STS w/ unilateral UE support (able to complete 1 rep w/o UE suppor) 3x 10 with seated rest break between sets with min cuing for full hip ext - Sidestepping on airex pad over 56ft (down and back 2x) with red tband at ankles for resistance for 3 sets; minA from PT with cuing for foot clearance with good carry over; seated rest breaks between each set - MATRIX hip ext 25# 3x 10 with cuing for posture and eccentric control with needed cuing for maintenance of proper form throughout                        PT Education - 10/17/17 1006    Education Details  Exercise form    Person(s) Educated  Patient    Methods  Explanation;Demonstration;Verbal cues    Comprehension  Verbalized understanding;Returned demonstration;Verbal cues required       PT Short Term Goals - 09/10/17 2132      PT SHORT TERM GOAL #1   Title  Pt will be independent with HEP in order to improve strength and balance  in order to decrease fall risk and improve function at home and work.    Time  4    Period  Weeks    Status  New        PT Long Term Goals - 09/10/17 2122      PT LONG TERM GOAL #1   Title  Pt will improve DGI by at least 3 points in order to demonstrate clinically significant improvement in balance and decreased risk for falls    Baseline  09/10/17 11/16    Time  8    Period  Weeks    Status  New      PT LONG TERM GOAL #2   Title  Pt will improve BERG by at least 3 points in order to demonstrate clinically significant improvement in balance.    Baseline  09/10/17 44/56    Time  8    Period  Weeks    Status  New      PT LONG TERM GOAL #3   Title  Patient will demonstrate 4/5 hip ext bilat to normalize gait and improve hip strategy to reduce falls    Baseline  09/10/17 3+/5 biat hip ext    Time  8    Period  Weeks    Status  New             Plan - 10/17/17 1102    Clinical Impression Statement  PT continued to progress therex as tolerated, with patient requiring longer breaks between sets this am. Patient is demonstrating better attempt at posture correction with PT cuing between PT sessions.     Rehab Potential  Good    Clinical Impairments Affecting Rehab Potential  (+) support system, motivation (-) age, dementia, other comorbidities, hx of falls    PT Frequency  2x / week    PT Duration  8 weeks    PT Treatment/Interventions  Passive range of motion;Neuromuscular re-education;Gait training;Dry needling;Manual techniques;Patient/family education;Balance training;Therapeutic exercise;Therapeutic activities;Moist Heat;Functional mobility training;Aquatic Therapy    PT Next Visit Plan  dynamic balance and LE strengthening.     PT Home Exercise Plan  bridge, standing heel raises, thomas stretch    Consulted and Agree with Plan of Care  Patient    Family Member Consulted  Daughter       Patient will benefit from skilled therapeutic intervention in order to improve the following deficits and impairments:  Abnormal gait, Increased fascial restricitons, Impaired sensation, Pain, Improper body mechanics, Postural dysfunction, Impaired tone, Increased muscle spasms, Decreased mobility, Decreased range of motion, Decreased endurance, Decreased activity tolerance, Decreased strength, Decreased balance, Difficulty walking, Impaired flexibility  Visit Diagnosis: Other abnormalities of gait and mobility     Problem List There are no active problems to display for this patient.  Shelton Silvas PT, DPT Shelton Silvas 10/17/2017, 11:06 AM  [place] [address], [place], [address] Phone: [phone]   Fax:  [phone]  Name: Kyle Barron MRN: [id number] Date of Birth: 10-02-1920

## 2017-10-20 ENCOUNTER — Ambulatory Visit: Payer: Medicare Other | Admitting: Physical Therapy

## 2017-10-22 ENCOUNTER — Ambulatory Visit: Payer: Medicare Other | Admitting: Physical Therapy

## 2017-10-22 ENCOUNTER — Encounter: Payer: Medicare Other | Admitting: Physical Therapy

## 2017-10-22 ENCOUNTER — Encounter: Payer: Self-pay | Admitting: Physical Therapy

## 2017-10-22 DIAGNOSIS — R2689 Other abnormalities of gait and mobility: Secondary | ICD-10-CM

## 2017-10-22 NOTE — Therapy (Signed)
[facility] [address], [place], [address] Phone: [phone]   Fax:  [phone]  Physical Therapy Treatment  Patient Details  Name: Kyle Barron MRN: [id number] Date of Birth: November 24, 1920 Referring Provider: Netty Starring MD   Encounter Date: 10/22/2017  PT End of Session - 10/22/17 1524    Visit Number  10    Number of Visits  17    Date for PT Re-Evaluation  11/05/17    PT Start Time  0315    PT Stop Time  0410    PT Time Calculation (min)  55 min    Equipment Utilized During Treatment  Gait belt    Activity Tolerance  Patient tolerated treatment well    Behavior During Therapy  [facility] for tasks assessed/performed       Past Medical History:  Diagnosis Date  . Alzheimer disease   . Hypertension     History reviewed. No pertinent surgical history.  There were no vitals filed for this visit.  Subjective Assessment - 10/22/17 1521    Subjective  Patient reports no falls or pain since last visit. Patient reports compliance with his HEP with no questions or concerns.     Patient is accompained by:  Family member    Pertinent History  Patient is a 84 y/o male presenting with referral for balance and gait deficits. Patient's daughter reports a "couple" falls in the past month, and her and patient report that falls/unsteadiness occurs when patient is turning when walking. Patient reports no pain. Patient lives alone, since his wife passed 3 months ago, which he is emotional about- but has a caretaker in the am, and his daughter assists him in the pm with heavy chores, and cooking.  Patient is completing basic ADLs on his own (bathing, dressing). Daughter reports patient has alzheimer's and is not safe at this time to cook on his own.      Limitations  Lifting;Standing;Walking;House hold activities    How long can you sit comfortably?  unlimited    How long can you stand comfortably?  5 mins    How long  can you walk comfortably?  5 mins    Diagnostic tests  None at this time    Patient Stated Goals  Reduce falls, walk longer/more steady          Ther-Ex - Nustep L35mns; L4 572ms  - PT led patient through components of BERG balance assessment with patient demonstrating most difficulty in SLS and tandem stance. Patient is able to almost rise from chair without UE use, but requires this for safety. Patient has improved in his ability to safely lower to sit in a chair without UE use - Led patient through components of DGI with patient having most difficulty with horizontal head turns. Patient slows down before stepping over obstacle, to stop and turn, and with vertical head turns - 5xSTS trials with best time of 21sec with minimal UE use - Gait speed trials with best time 1.23109mover 50m76mpdate HEP with visual and verbal review of therex: Standing heel raises with countertop support, standing hip abd with countertop support, standing hip ext with countertop support, and STS with minimal UE support with chair behind for safety - Review with patient on progress with balance and continuing glute targetted strengthening to improve STS transfer ensuring balance (standing from bed/chair has been a cause of LOB in the past) and continuing dynamic gait challenges to decrease  fall risk with this. Patient and daughter verbalize understanding of all provided education.                         PT Education - 10/22/17 1523    Education Details  Exercise form    Person(s) Educated  Patient    Methods  Explanation;Demonstration;Verbal cues    Comprehension  Verbalized understanding;Returned demonstration;Verbal cues required       PT Short Term Goals - 09/10/17 2132      PT SHORT TERM GOAL #1   Title  Pt will be independent with HEP in order to improve strength and balance in order to decrease fall risk and improve function at home and work.    Time  4    Period  Weeks    Status   New        PT Long Term Goals - 10/22/17 1542      PT LONG TERM GOAL #1   Title  Pt will improve DGI to at least 19/24 to demonstrate decreased risk for falls    Baseline  10/22/17 15/24    Time  8    Period  Weeks      PT LONG TERM GOAL #2   Title  Pt will improve BERG by at least 3 points in order to demonstrate clinically significant improvement in balance.    Baseline  10/22/17 46/56    Time  8    Period  Weeks    Status  Partially Met      PT LONG TERM GOAL #3   Title  Patient will demonstrate 4/5 hip ext bilat to normalize gait and improve hip strategy to reduce falls    Status  Deferred      PT LONG TERM GOAL #4   Title  Pt will decrease 5TSTS by at least 3 seconds in order to demonstrate clinically significant improvement in LE strength    Baseline  10/22/17: 21sec    Time  8    Period  Weeks    Status  New      PT LONG TERM GOAL #5   Title  Pt will increase 10MWT by at least 0.13 m/s in order to demonstrate clinically significant improvement in community ambulation.    Baseline  10/22/17: 1.85ms    Time  8    Period  Weeks    Status  New            Plan - 10/22/17 1618    Clinical Impression Statement  PT updated patient's goals this session. Patient is demonstrating increased strength and static and dynamic balance, with remaining deficits in hip ext strength, high level dynamic gait challenge, and SLS/tandem stance. Patient is continuing to need rest breaks between activity but is deomnstrating increased LE/cardiovascular endurance between sessions by completing higher level challenge and longer duration with nustep. Patient will continue to benefit from skilled PT services to address LE strength needed for transfers and prevention of LOB strategies; and to continue improvement in dynmaic gait.     Rehab Potential  Good    Clinical Impairments Affecting Rehab Potential  (+) support system, motivation (-) age, dementia, other comorbidities, hx of falls    PT  Frequency  2x / week    PT Duration  8 weeks    PT Treatment/Interventions  Passive range of motion;Neuromuscular re-education;Gait training;Dry needling;Manual techniques;Patient/family education;Balance training;Therapeutic exercise;Therapeutic activities;Moist Heat;Functional mobility training;Aquatic Therapy    PT Next  Visit Plan  dynamic balance and LE strengthening.     PT Home Exercise Plan  bridge, standing heel raises, thomas stretch    Consulted and Agree with Plan of Care  Patient    Family Member Consulted  Daughter       Patient will benefit from skilled therapeutic intervention in order to improve the following deficits and impairments:  Abnormal gait, Increased fascial restricitons, Impaired sensation, Pain, Improper body mechanics, Postural dysfunction, Impaired tone, Increased muscle spasms, Decreased mobility, Decreased range of motion, Decreased endurance, Decreased activity tolerance, Decreased strength, Decreased balance, Difficulty walking, Impaired flexibility  Visit Diagnosis: Other abnormalities of gait and mobility     Problem List There are no active problems to display for this patient.  Shelton Silvas PT, DPT Shelton Silvas 10/22/2017, 4:23 PM  [facility] [address] [place], [address] Phone: [phone]   Fax:  [phone]  Name: Kyle Barron MRN: [id number] Date of Birth: [birth year]

## 2017-10-27 ENCOUNTER — Ambulatory Visit: Payer: Medicare Other | Admitting: Physical Therapy

## 2017-10-27 ENCOUNTER — Other Ambulatory Visit: Payer: Self-pay

## 2017-10-27 ENCOUNTER — Encounter: Payer: Self-pay | Admitting: Emergency Medicine

## 2017-10-27 ENCOUNTER — Inpatient Hospital Stay
Admission: EM | Admit: 2017-10-27 | Discharge: 2017-10-31 | DRG: 378 | Disposition: A | Discharge status: Home / Self Care | Payer: Medicare Other | Attending: Internal Medicine | Admitting: Internal Medicine

## 2017-10-27 ENCOUNTER — Encounter: Payer: Self-pay | Admitting: Physical Therapy

## 2017-10-27 DIAGNOSIS — F0280 Dementia in other diseases classified elsewhere without behavioral disturbance: Secondary | ICD-10-CM | POA: Diagnosis present

## 2017-10-27 DIAGNOSIS — Z79899 Other long term (current) drug therapy: Secondary | ICD-10-CM | POA: Diagnosis not present

## 2017-10-27 DIAGNOSIS — Z66 Do not resuscitate: Secondary | ICD-10-CM | POA: Diagnosis present

## 2017-10-27 DIAGNOSIS — Z888 Allergy status to other drugs, medicaments and biological substances status: Secondary | ICD-10-CM | POA: Diagnosis not present

## 2017-10-27 DIAGNOSIS — R310 Gross hematuria: Secondary | ICD-10-CM | POA: Diagnosis not present

## 2017-10-27 DIAGNOSIS — D649 Anemia, unspecified: Secondary | ICD-10-CM | POA: Diagnosis present

## 2017-10-27 DIAGNOSIS — I10 Essential (primary) hypertension: Secondary | ICD-10-CM | POA: Diagnosis present

## 2017-10-27 DIAGNOSIS — Z7982 Long term (current) use of aspirin: Secondary | ICD-10-CM | POA: Diagnosis not present

## 2017-10-27 DIAGNOSIS — I482 Chronic atrial fibrillation, unspecified: Secondary | ICD-10-CM | POA: Diagnosis present

## 2017-10-27 DIAGNOSIS — I452 Bifascicular block: Secondary | ICD-10-CM | POA: Diagnosis present

## 2017-10-27 DIAGNOSIS — Z87891 Personal history of nicotine dependence: Secondary | ICD-10-CM | POA: Diagnosis not present

## 2017-10-27 DIAGNOSIS — Z885 Allergy status to narcotic agent status: Secondary | ICD-10-CM | POA: Diagnosis not present

## 2017-10-27 DIAGNOSIS — D123 Benign neoplasm of transverse colon: Secondary | ICD-10-CM | POA: Diagnosis present

## 2017-10-27 DIAGNOSIS — K229 Disease of esophagus, unspecified: Secondary | ICD-10-CM | POA: Diagnosis not present

## 2017-10-27 DIAGNOSIS — K3189 Other diseases of stomach and duodenum: Secondary | ICD-10-CM

## 2017-10-27 DIAGNOSIS — K922 Gastrointestinal hemorrhage, unspecified: Secondary | ICD-10-CM | POA: Diagnosis present

## 2017-10-27 DIAGNOSIS — R2689 Other abnormalities of gait and mobility: Secondary | ICD-10-CM

## 2017-10-27 DIAGNOSIS — G309 Alzheimer's disease, unspecified: Secondary | ICD-10-CM | POA: Diagnosis present

## 2017-10-27 DIAGNOSIS — K648 Other hemorrhoids: Secondary | ICD-10-CM | POA: Diagnosis present

## 2017-10-27 DIAGNOSIS — K921 Melena: Principal | ICD-10-CM | POA: Diagnosis present

## 2017-10-27 DIAGNOSIS — D62 Acute posthemorrhagic anemia: Secondary | ICD-10-CM | POA: Diagnosis present

## 2017-10-27 DIAGNOSIS — K2270 Barrett's esophagus without dysplasia: Secondary | ICD-10-CM | POA: Diagnosis present

## 2017-10-27 DIAGNOSIS — Z79890 Hormone replacement therapy (postmenopausal): Secondary | ICD-10-CM

## 2017-10-27 DIAGNOSIS — K5520 Angiodysplasia of colon without hemorrhage: Secondary | ICD-10-CM | POA: Diagnosis present

## 2017-10-27 LAB — ABO/RH: ABO/RH(D): O POS

## 2017-10-27 LAB — COMPREHENSIVE METABOLIC PANEL
ALBUMIN: 3.5 g/dL (ref 3.5–5.0)
ALT: 9 U/L (ref 0–44)
ANION GAP: 12 (ref 5–15)
AST: 18 U/L (ref 15–41)
Alkaline Phosphatase: 68 U/L (ref 38–126)
BUN: 28 mg/dL — ABNORMAL HIGH (ref 8–23)
CO2: 22 mmol/L (ref 22–32)
Calcium: 9.3 mg/dL (ref 8.9–10.3)
Chloride: 106 mmol/L (ref 98–111)
Creatinine, Ser: 1.19 mg/dL (ref 0.61–1.24)
GFR calc non Af Amer: 50 mL/min — ABNORMAL LOW (ref >60)
GFR, EST AFRICAN AMERICAN: 58 mL/min — AB (ref >60)
GLUCOSE: 115 mg/dL — AB (ref 70–99)
POTASSIUM: 4.4 mmol/L (ref 3.5–5.1)
SODIUM: 140 mmol/L (ref 135–145)
TOTAL PROTEIN: 7.2 g/dL (ref 6.5–8.1)
Total Bilirubin: 0.6 mg/dL (ref 0.3–1.2)

## 2017-10-27 LAB — CBC
HEMATOCRIT: 17.9 % — AB (ref 40.0–52.0)
HEMOGLOBIN: 5.6 g/dL — AB (ref 13.0–18.0)
MCH: 23 pg — AB (ref 26.0–34.0)
MCHC: 31.2 g/dL — AB (ref 32.0–36.0)
MCV: 73.5 fL — ABNORMAL LOW (ref 80.0–100.0)
Platelets: 280 10*3/uL (ref 150–440)
RBC: 2.44 MIL/uL — AB (ref 4.40–5.90)
RDW: 19 % — ABNORMAL HIGH (ref 11.5–14.5)
WBC: 7.6 10*3/uL (ref 3.8–10.6)

## 2017-10-27 LAB — PREPARE RBC (CROSSMATCH)

## 2017-10-27 MED ORDER — PANTOPRAZOLE SODIUM 40 MG IV SOLR: 40.0000 mg | Freq: Two times a day (BID) | INTRAVENOUS | Status: DC

## 2017-10-27 MED ORDER — SODIUM CHLORIDE 0.9 % IV SOLN
10.0000 mL/h | Freq: Once | INTRAVENOUS | Status: AC
  Administered 2017-10-27: 10 mL/h via INTRAVENOUS

## 2017-10-27 MED ORDER — PANTOPRAZOLE SODIUM 40 MG IV SOLR
40.0000 mg | Freq: Two times a day (BID) | INTRAVENOUS | Status: DC
  Administered 2017-10-27 – 2017-10-29 (×4): 40 mg via INTRAVENOUS
  Filled 2017-10-27 (×4): qty 40

## 2017-10-27 MED ORDER — ACETAMINOPHEN 650 MG RE SUPP: 650.0000 mg | Freq: Four times a day (QID) | RECTAL | Status: DC | PRN

## 2017-10-27 MED ORDER — SODIUM CHLORIDE 0.9% IV SOLUTION
Freq: Once | INTRAVENOUS | Status: AC
  Administered 2017-10-28: 50 mL via INTRAVENOUS
  Filled 2017-10-27: qty 250

## 2017-10-27 MED ORDER — ACETAMINOPHEN 325 MG PO TABS: 650.0000 mg | ORAL_TABLET | Freq: Four times a day (QID) | ORAL | Status: DC | PRN

## 2017-10-27 NOTE — ED Notes (Signed)
Pt's pulse ox is 91% on room air. Placed pt on 3 lpm via  and sats are now 100% on supplemental oxygen.

## 2017-10-27 NOTE — ED Triage Notes (Addendum)
Pt arrived via POV with daughter with reports of low hemoglobin (4.9) from today. Pt was in rehab and daughter noticed pt was short of breath and took him to PCP's office and found his hemoglobin low.  Daughter states he has previous hx of GI bleed. Pt reports he is feeling tired. Pt is pale.

## 2017-10-27 NOTE — ED Provider Notes (Signed)
[facility] Emergency Department Provider Note  ____________________________________________   First MD Initiated Contact with Patient 10/27/17 1657     (approximate)  I have reviewed the triage vital signs and the nursing notes.   HISTORY  Chief Complaint Anemia   HPI Kyle Barron is a 84 y.o. male with a history of Alzheimer's dementia as well as hypertension was presented to the emergency department today with symptomatic anemia.  He states that he is getting short of breath while walking.  Says that he also has intermittently on and off black stools.  Last black stool this morning.  Says that he only takes aspirin and was previously on a more powerful anticoagulant for atrial fibrillation when he had his first GI bleeding.  Daughter also says that he appears pale recently.   Past Medical History:  Diagnosis Date  . Alzheimer disease   . Hypertension     There are no active problems to display for this patient.   History reviewed. No pertinent surgical history.  Prior to Admission medications   Not on File    Allergies Codeine; Meperidine; and Warfarin  No family history on file.  Social History Social History   Tobacco Use  . Smoking status: Former Research scientist (life sciences)  . Smokeless tobacco: Never Used  Substance Use Topics  . Alcohol use: Not on file  . Drug use: Not on file    Review of Systems  Constitutional: No fever/chills Eyes: No visual changes. ENT: No sore throat. Cardiovascular: Denies chest pain. Respiratory: As above Gastrointestinal: No abdominal pain.  No nausea, no vomiting.  No diarrhea.  No constipation. Genitourinary: Negative for dysuria. Musculoskeletal: Negative for back pain. Skin: Negative for rash. Neurological: Negative for headaches, focal weakness or numbness.   ____________________________________________   PHYSICAL EXAM:  VITAL SIGNS: ED Triage Vitals  Enc Vitals Group     BP 10/27/17 1638  115/61     Pulse Rate 10/27/17 1638 60     Resp 10/27/17 1638 16     Temp 10/27/17 1638 98.2 F (36.8 C)     Temp Source 10/27/17 1638 Oral     SpO2 --      Weight 10/27/17 1641 160 lb (72.6 kg)     Height 10/27/17 1640 5\' 11"  (1.803 m)     Head Circumference --      Peak Flow --      Pain Score 10/27/17 1639 0     Pain Loc --      Pain Edu? --      Excl. in [place]? --     Constitutional: Alert and oriented. Well appearing and in no acute distress. Eyes: Conjunctivae are normal.  Head: Atraumatic. Nose: No congestion/rhinnorhea. Mouth/Throat: Mucous membranes are moist.  Neck: No stridor.   Cardiovascular: Normal rate, regular rhythm. Grossly normal heart sounds.   Respiratory: Normal respiratory effort.  No retractions. Lungs CTAB. Gastrointestinal: Soft and nontender. No distention.  Grossly maroon stool which is strongly heme positive. Musculoskeletal: No lower extremity tenderness nor edema.  No joint effusions. Neurologic:  Normal speech and language. No gross focal neurologic deficits are appreciated. Skin:  Skin is warm, dry and intact. No rash noted. Psychiatric: Mood and affect are normal. Speech and behavior are normal.  ____________________________________________   LABS (all labs ordered are listed, but only abnormal results are displayed)  Labs Reviewed  COMPREHENSIVE METABOLIC PANEL - Abnormal; Notable for the following components:      Result Value   Glucose,  Bld 115 (*)    BUN 28 (*)    GFR calc non Af Amer 50 (*)    GFR calc Af Amer 58 (*)    All other components within normal limits  CBC - Abnormal; Notable for the following components:   RBC 2.44 (*)    Hemoglobin 5.6 (*)    HCT 17.9 (*)    MCV 73.5 (*)    MCH 23.0 (*)    MCHC 31.2 (*)    RDW 19.0 (*)    All other components within normal limits  TYPE AND SCREEN  PREPARE RBC (CROSSMATCH)   ____________________________________________  EKG  ED ECG REPORT I, Doran Stabler, the attending  physician, personally viewed and interpreted this ECG.   Date: 10/27/2017  EKG Time: 1712  Rate: 83  Rhythm: atrial fibrillation, rate 83.  PVC x1.  Axis: Normal  Intervals:Right bundle branch block and left anterior fascicular block.  ST&T Change: No ST segment elevation or depression.  No abnormal T wave inversion.  ____________________________________________  [id number]  ____________________________________________   PROCEDURES  Procedure(s) performed:   .Critical Care Performed by: Orbie Pyo, MD Authorized by: Orbie Pyo, MD   Critical care provider statement:    Critical care time (minutes):  35   Critical care was necessary to treat or prevent imminent or life-threatening deterioration of the following conditions:  Circulatory failure   Critical care was time spent personally by me on the following activities:  Discussions with consultants, evaluation of patient's response to treatment, examination of patient, ordering and performing treatments and interventions, ordering and review of laboratory studies, ordering and review of radiographic studies, pulse oximetry, re-evaluation of patient's condition, obtaining history from patient or surrogate and review of old charts    Critical Care performed:   ____________________________________________   INITIAL IMPRESSION / [place] / ED COURSE  Pertinent labs & imaging results that were available during my care of the patient were reviewed by me and considered in my medical decision making (see chart for details).  Differential includes, but is not limited to, viral syndrome, bronchitis including COPD exacerbation, pneumonia, reactive airway disease including asthma, CHF including exacerbation with or without pulmonary/interstitial edema, pneumothorax, ACS, thoracic trauma, and pulmonary embolism. As part of my medical decision making, I reviewed the following data within the  [facility] outpatient records reviewed.  ----------------------------------------- 5:46 PM on 10/27/2017 -----------------------------------------  Patient with symptomatically anemia.  Likely from GI bleeding.  Vitals stable at this time.  To be admitted to the hospital.  Patient as well as daughter are aware.  Patient signed out to Dr. Posey Pronto of the medicine service. ____________________________________________   FINAL CLINICAL IMPRESSION(S) / ED DIAGNOSES  Symptom medic anemia.  GI bleeding.   NEW MEDICATIONS STARTED DURING THIS VISIT:  New Prescriptions   No medications on file     Note:  This document was prepared using Dragon voice recognition software and may include unintentional dictation errors.     Orbie Pyo, MD 10/27/17 [phone]

## 2017-10-27 NOTE — Progress Notes (Signed)
Family Meeting Note  Advance Directive yes patient's daughter is healthcare power of attorney. Mr. Saab is being admitted for melena/G.I. bleed came in with hemoglobin of 5.6. He has history of gastritis chronic a fib on aspirin. Does not take any blood thinners. He is otherwise hemodynamically stable. Code status address with daughter. Patient has a DNR. Time spent 16 minutes  Fritzi Mandes, MD

## 2017-10-27 NOTE — Therapy (Signed)
[facility] [address] [place], [address] Phone: [phone]   Fax:  [phone]  Physical Therapy Treatment  Patient Details  Name: Kyle Barron MRN: [id number] Date of Birth: April 02, 1920 Referring Provider (PT): Netty Starring MD   Encounter Date: 10/27/2017  PT End of Session - 10/27/17 1438    Visit Number  11    Number of Visits  17    Date for PT Re-Evaluation  11/05/17    PT Start Time  0145    PT Stop Time  0227    PT Time Calculation (min)  42 min    Activity Tolerance  Patient tolerated treatment well    Behavior During Therapy  [facility] for tasks assessed/performed       Past Medical History:  Diagnosis Date  . Alzheimer disease   . Hypertension     History reviewed. No pertinent surgical history.  There were no vitals filed for this visit.  Subjective Assessment - 10/27/17 1349    Subjective  Patient's daughter reports over the past week-week and a half patient has had trouble breathing. PT noted last session patietn was more lethargic than usual requiring more rest breaks between sessions. PT's daughter expressed concerns about patient's SOB and CHF and PT encouraged her to follow up with pts PCP about this.     Patient is accompained by:  Family member    Pertinent History  Patient is a 84 y/o male presenting with referral for balance and gait deficits. Patient's daughter reports a "couple" falls in the past month, and her and patient report that falls/unsteadiness occurs when patient is turning when walking. Patient reports no pain. Patient lives alone, since his wife passed 3 months ago, which he is emotional about- but has a caretaker in the am, and his daughter assists him in the pm with heavy chores, and cooking.  Patient is completing basic ADLs on his own (bathing, dressing). Daughter reports patient has alzheimer's and is not safe at this time to cook on his own.      Limitations   Lifting;Standing;Walking;House hold activities    How long can you sit comfortably?  unlimited    How long can you stand comfortably?  5 mins    How long can you walk comfortably?  5 mins    Diagnostic tests  None at this time    Patient Stated Goals  Reduce falls, walk longer/more steady        Ther-Ex - Nustep 44mn L1 with PT attempting to check 02 throughout with little success d/t patient hand circulation  -PT continued to attempt to check Sp02 with 1 reading of 77%, which PT thinks is error - Listen to lung sounds, no severe abnormalities, mild wheeze at bilat upper lobes on inhale - 2 trials of walking- 1.576mutes with patient reporting SOB that subsides in 5 mins with PT cuing to take "deep breaths in the nose and out the mouth". Over 2nd trial patient is able to walk 2002fn 1mi18m4 sec with Sp02 98-100 throughout walking and after. HR increased to highest: 139bpm that is decreased to 65bpm over 10mi17m Following patient sitting at HR: 65bpm; Sp02: 100%; and BP 127/72 mmHg consistently for 10min50mient left clinic to go to PCP. While patient was sitting PT reviewed numbers with daughter for reporting to PCP at appt this afternoon.  PT Education - 10/27/17 1437    Education Details  Follow up with PCP for SOB concerns    Person(s) Educated  Patient    Methods  Explanation    Comprehension  Verbalized understanding       PT Short Term Goals - 09/10/17 2132      PT SHORT TERM GOAL #1   Title  Pt will be independent with HEP in order to improve strength and balance in order to decrease fall risk and improve function at home and work.    Time  4    Period  Weeks    Status  New        PT Long Term Goals - 10/22/17 1542      PT LONG TERM GOAL #1   Title  Pt will improve DGI to at least 19/24 to demonstrate decreased risk for falls    Baseline  10/22/17 15/24    Time  8    Period  Weeks      PT LONG TERM GOAL #2   Title  Pt  will improve BERG by at least 3 points in order to demonstrate clinically significant improvement in balance.    Baseline  10/22/17 46/56    Time  8    Period  Weeks    Status  Partially Met      PT LONG TERM GOAL #3   Title  Patient will demonstrate 4/5 hip ext bilat to normalize gait and improve hip strategy to reduce falls    Status  Deferred      PT LONG TERM GOAL #4   Title  Pt will decrease 5TSTS by at least 3 seconds in order to demonstrate clinically significant improvement in LE strength    Baseline  10/22/17: 21sec    Time  8    Period  Weeks    Status  New      PT LONG TERM GOAL #5   Title  Pt will increase 10MWT by at least 0.13 m/s in order to demonstrate clinically significant improvement in community ambulation.    Baseline  10/22/17: 1.65ms    Time  8    Period  Weeks    Status  New            Plan - 10/27/17 1439    Clinical Impression Statement  Patient an daughter presenting today with complaints of SOB walking between rooms. PT attempted therex with assessment of these symptoms (see therapy note for detailed report) and PT referred patient to PCP for elevated HR. Patient's BP and Sp02 are steady throughout therex, but HR elevated up to 139bpm and patient is unable to ambulate more than 1.5 mins over 2017fwhich is unusual for this patient. Patient is also requiring HHA for safety, and is usually more steady on his feet.     Rehab Potential  Good    Clinical Impairments Affecting Rehab Potential  (+) support system, motivation (-) age, dementia, other comorbidities, hx of falls    PT Frequency  2x / week    PT Duration  8 weeks    PT Treatment/Interventions  Passive range of motion;Neuromuscular re-education;Gait training;Dry needling;Manual techniques;Patient/family education;Balance training;Therapeutic exercise;Therapeutic activities;Moist Heat;Functional mobility training;Aquatic Therapy    PT Next Visit Plan  dynamic balance and LE strengthening.     PT  Home Exercise Plan  bridge, standing heel raises, thomas stretch    Consulted and Agree with Plan of Care  Patient    Family Member  Consulted  Daughter       Patient will benefit from skilled therapeutic intervention in order to improve the following deficits and impairments:  Abnormal gait, Increased fascial restricitons, Impaired sensation, Pain, Improper body mechanics, Postural dysfunction, Impaired tone, Increased muscle spasms, Decreased mobility, Decreased range of motion, Decreased endurance, Decreased activity tolerance, Decreased strength, Decreased balance, Difficulty walking, Impaired flexibility  Visit Diagnosis: Other abnormalities of gait and mobility     Problem List There are no active problems to display for this patient.  Shelton Silvas PT, DPT Shelton Silvas 10/27/2017, 2:41 PM  [facility] [address], [place], [address] Phone: [phone]   Fax:  [phone]  Name: JULIAN MEDINA MRN: [id number] Date of Birth: [birth year]

## 2017-10-27 NOTE — H&P (Signed)
[facility] [place] NAME: Kyle Barron    MR#:  [id number]  DATE OF BIRTH:  Jun 15, 1920  DATE OF ADMISSION:  10/27/2017  PRIMARY CARE PHYSICIAN: Dion Body, MD   REQUESTING/REFERRING PHYSICIAN: Dr Clearnce Hasten  CHIEF COMPLAINT:   Increasing shortness of breath and looking pale HISTORY OF PRESENT ILLNESS:  Kyle Barron  is a 84 y.o. male with a known history of gastritis, Alzheimer's disease, hypertension, chronic a fib on only aspirin comes to the emergency room accompanied by daughter who mentioned patient has been feeling tired, fatigue, short of breath and looking pale lately. he was found to have hemoglobin of 4.9 at primary care's office. Hemoglobin was 5.6 here in the ER patient's hemoglobin was 12.7 3 months ago daughter he has mentioned about intermittent melena   pt had EGD in 2014 which showed gastritis. He does not take PPI on a regular basis colonoscopy was not done in 2014 due to his age this is per patient's daughters report.  Patient is being admitted with iron deficiency anemia. PAST MEDICAL HISTORY:   Past Medical History:  Diagnosis Date  . Alzheimer disease   . Hypertension     PAST SURGICAL HISTOIRY:  History reviewed. No pertinent surgical history.  SOCIAL HISTORY:   Social History   Tobacco Use  . Smoking status: Former Research scientist (life sciences)  . Smokeless tobacco: Never Used  Substance Use Topics  . Alcohol use: Not on file    FAMILY HISTORY:  No family history on file.  DRUG ALLERGIES:   Allergies  Allergen Reactions  . Codeine Nausea And Vomiting  . Meperidine Other (See Comments) and Nausea And Vomiting    Caused vomiting & altered mental status  . Warfarin Other (See Comments)    Caused gastrointestinal bleeding- anemia    REVIEW OF SYSTEMS:  Review of Systems  Constitutional: Positive for malaise/fatigue. Negative for chills, fever and weight loss.  HENT: Negative for ear  discharge, ear pain and nosebleeds.   Eyes: Negative for blurred vision, pain and discharge.  Respiratory: Negative for sputum production, shortness of breath, wheezing and stridor.   Cardiovascular: Negative for chest pain, palpitations, orthopnea and PND.  Gastrointestinal: Positive for melena. Negative for abdominal pain, diarrhea, nausea and vomiting.  Genitourinary: Negative for frequency and urgency.  Musculoskeletal: Negative for back pain and joint pain.  Neurological: Positive for dizziness and weakness. Negative for sensory change, speech change and focal weakness.  Psychiatric/Behavioral: Negative for depression and hallucinations. The patient is not nervous/anxious.      MEDICATIONS AT HOME:   Prior to Admission medications   Medication Sig Start Date End Date Taking? Authorizing Provider  aspirin EC 81 MG tablet Take 81 mg by mouth at bedtime.   Yes [provider]  Cholecalciferol (VITAMIN D3) 2000 units capsule Take 2,000 Units by mouth daily.   Yes [provider]  divalproex (DEPAKOTE) 250 MG DR tablet Take 250 mg by mouth at bedtime. 08/08/17  Yes [provider]  levothyroxine (SYNTHROID, LEVOTHROID) 100 MCG tablet Take 100 mcg by mouth daily. 09/19/17  Yes [provider]  memantine (NAMENDA) 10 MG tablet Take 10 mg by mouth 2 (two) times daily. 10/18/17  Yes [provider]  metoprolol succinate (TOPROL-XL) 25 MG 24 hr tablet Take 25 mg by mouth daily. 10/21/17  Yes [provider]  mirtazapine (REMERON) 15 MG tablet Take 15 mg by mouth at bedtime. 08/18/17  Yes [provider]  vitamin B-12 (  CYANOCOBALAMIN) 1000 MCG tablet Take 1,000 mcg by mouth daily.   Yes [provider]      VITAL SIGNS:  Blood pressure 129/76, pulse 78, temperature 98.2 F (36.8 C), temperature source Oral, resp. rate 15, height 5\' 11"  (1.803 m), weight 72.6 kg, SpO2 100 %.  PHYSICAL EXAMINATION:  GENERAL:  84 y.o.-year-old  patient lying in the bed with no acute distress. Pallor+ EYES: Pupils equal, round, reactive to light and accommodation. No scleral icterus. Extraocular muscles intact.  HEENT: Head atraumatic, normocephalic. Oropharynx and nasopharynx clear.  NECK:  Supple, no jugular venous distention. No thyroid enlargement, no tenderness.  LUNGS: Normal breath sounds bilaterally, no wheezing, rales,rhonchi or crepitation. No use of accessory muscles of respiration.  CARDIOVASCULAR: S1, S2 normal. No murmurs, rubs, or gallops.  ABDOMEN: Soft, nontender, nondistended. Bowel sounds present. No organomegaly or mass.  EXTREMITIES: No pedal edema, cyanosis, or clubbing.  NEUROLOGIC: Cranial nerves II through XII are intact. Muscle strength 5/5 in all extremities. Sensation intact. Gait not checked.  PSYCHIATRIC: The patient is alert and awake.  SKIN: No obvious rash, lesion, or ulcer.   LABORATORY PANEL:   CBC Recent Labs  Lab 10/27/17 1655  WBC 7.6  HGB 5.6*  HCT 17.9*  PLT 280   ------------------------------------------------------------------------------------------------------------------  Chemistries  Recent Labs  Lab 10/27/17 1655  NA 140  K 4.4  CL 106  CO2 22  GLUCOSE 115*  BUN 28*  CREATININE 1.19  CALCIUM 9.3  AST 18  ALT 9  ALKPHOS 68  BILITOT 0.6   ------------------------------------------------------------------------------------------------------------------  Cardiac Enzymes No results for input(s): TROPONINI in the last 168 hours. ------------------------------------------------------------------------------------------------------------------  RADIOLOGY:  No results found.  EKG:    IMPRESSION AND PLAN:  Kyle Barron  is a 84 y.o. male with a known history of gastritis, Alzheimer's disease, hypertension, chronic a fib on only aspirin comes to the emergency room accompanied by daughter who mentioned patient has been feeling tired, fatigue, short of breath and  looking pale lately. he was found to have hemoglobin of 4.9 at primary care's office. Hemoglobin was 5.6 here in the ER  1. Melena/G.I. Bleed-- likely upper. Cannot rule out lower -patient had hemoglobin of 12.7 3 months ago--- 4.9--- 5.6-- two units of blood transfusion today -IV PPI BID -spoke with Dr. Katherina Mires. -consent for blood transfusion obtained from patient's daughter who is health care power of attorney -defer further G.I. workup with gastroenterologist  2. history of chronic a fib on chronic baby aspirin -aspirin hold  3. Dementia -pt is pleasantly confused  4. DVT prophylaxis SCD Ted's All the records are reviewed and case discussed with ED provider. Management plans discussed with the patient, family and they are in agreement.  CODE STATUS: DNR  TOTAL TIME TAKING CARE OF THIS PATIENT: *45* minutes.    Fritzi Mandes M.D on 10/27/2017 at 7:02 PM  Between 7am to 6pm - Pager - [phone]  After 6pm go to [email or web address] - password [email or web address] [facility]  [facility]  Office  [phone]  CC: Primary care physician; Dion Body, MD

## 2017-10-27 NOTE — ED Notes (Signed)
Pt gave verbal consent for his daughter, Jean Rosenthal, to sign the consent for a blood transfusion.

## 2017-10-28 ENCOUNTER — Encounter: Payer: Self-pay | Admitting: Certified Registered Nurse Anesthetist

## 2017-10-28 ENCOUNTER — Inpatient Hospital Stay: Payer: Medicare Other | Admitting: Anesthesiology

## 2017-10-28 ENCOUNTER — Encounter
Admission: EM | Disposition: A | Discharge status: Home / Self Care | Payer: Self-pay | Source: Home / Self Care | Attending: Family Medicine

## 2017-10-28 DIAGNOSIS — D649 Anemia, unspecified: Secondary | ICD-10-CM

## 2017-10-28 DIAGNOSIS — K921 Melena: Principal | ICD-10-CM

## 2017-10-28 DIAGNOSIS — K3189 Other diseases of stomach and duodenum: Secondary | ICD-10-CM

## 2017-10-28 DIAGNOSIS — K2270 Barrett's esophagus without dysplasia: Secondary | ICD-10-CM

## 2017-10-28 DIAGNOSIS — K229 Disease of esophagus, unspecified: Secondary | ICD-10-CM

## 2017-10-28 HISTORY — PX: ESOPHAGOGASTRODUODENOSCOPY: SHX5428

## 2017-10-28 HISTORY — PX: ENTEROSCOPY: SHX5533

## 2017-10-28 LAB — URINALYSIS, COMPLETE (UACMP) WITH MICROSCOPIC
BILIRUBIN URINE: NEGATIVE
Bacteria, UA: NONE SEEN
GLUCOSE, UA: NEGATIVE mg/dL
Ketones, ur: NEGATIVE mg/dL
Leukocytes, UA: NEGATIVE
NITRITE: NEGATIVE
Protein, ur: NEGATIVE mg/dL
Specific Gravity, Urine: 1.013 (ref 1.005–1.030)
pH: 5 (ref 5.0–8.0)

## 2017-10-28 LAB — CBC
HCT: 20 % — ABNORMAL LOW (ref 40.0–52.0)
HEMOGLOBIN: 6.6 g/dL — AB (ref 13.0–18.0)
MCH: 24.9 pg — ABNORMAL LOW (ref 26.0–34.0)
MCHC: 33.3 g/dL (ref 32.0–36.0)
MCV: 75 fL — ABNORMAL LOW (ref 80.0–100.0)
PLATELETS: 199 10*3/uL (ref 150–440)
RBC: 2.66 MIL/uL — AB (ref 4.40–5.90)
RDW: 18.8 % — ABNORMAL HIGH (ref 11.5–14.5)
WBC: 6.4 10*3/uL (ref 3.8–10.6)

## 2017-10-28 LAB — KOH PREP: KOH Prep: NONE SEEN

## 2017-10-28 LAB — PREPARE RBC (CROSSMATCH)

## 2017-10-28 SURGERY — EGD (ESOPHAGOGASTRODUODENOSCOPY)
Anesthesia: General

## 2017-10-28 MED ORDER — LIDOCAINE HCL (CARDIAC) PF 100 MG/5ML IV SOSY
PREFILLED_SYRINGE | INTRAVENOUS | Status: DC | PRN
  Administered 2017-10-28: 50 mg via INTRAVENOUS

## 2017-10-28 MED ORDER — PROPOFOL 500 MG/50ML IV EMUL
INTRAVENOUS | Status: DC | PRN
  Administered 2017-10-28: 150 ug/kg/min via INTRAVENOUS

## 2017-10-28 MED ORDER — SODIUM CHLORIDE 0.9 % IV SOLN: INTRAVENOUS | Status: DC

## 2017-10-28 MED ORDER — SODIUM CHLORIDE 0.9% IV SOLUTION
Freq: Once | INTRAVENOUS | Status: AC
  Administered 2017-10-28: 11:00:00 via INTRAVENOUS

## 2017-10-28 MED ORDER — PROPOFOL 10 MG/ML IV BOLUS
INTRAVENOUS | Status: DC | PRN
  Administered 2017-10-28: 50 mg via INTRAVENOUS

## 2017-10-28 MED ORDER — PROPOFOL 500 MG/50ML IV EMUL
INTRAVENOUS | Status: AC
  Filled 2017-10-28: qty 50

## 2017-10-28 MED ORDER — PEG 3350-KCL-NA BICARB-NACL 420 G PO SOLR
4000.0000 mL | Freq: Once | ORAL | Status: AC
  Administered 2017-10-28: 4000 mL via ORAL
  Filled 2017-10-28: qty 4000

## 2017-10-28 MED ORDER — DIPHENHYDRAMINE HCL 25 MG PO CAPS
25.0000 mg | ORAL_CAPSULE | Freq: Once | ORAL | Status: AC
  Administered 2017-10-28: 25 mg via ORAL
  Filled 2017-10-28: qty 1

## 2017-10-28 MED ORDER — ACETAMINOPHEN 325 MG PO TABS
650.0000 mg | ORAL_TABLET | Freq: Once | ORAL | Status: AC
  Administered 2017-10-28: 650 mg via ORAL
  Filled 2017-10-28: qty 2

## 2017-10-28 MED ORDER — PEG 3350-KCL-NA BICARB-NACL 420 G PO SOLR
4000.0000 mL | Freq: Once | ORAL | Status: DC
  Filled 2017-10-28: qty 4000

## 2017-10-28 MED ORDER — FUROSEMIDE 10 MG/ML IJ SOLN
20.0000 mg | Freq: Once | INTRAMUSCULAR | Status: AC
  Administered 2017-10-28: 20 mg via INTRAVENOUS
  Filled 2017-10-28: qty 4

## 2017-10-28 MED ORDER — PHENYLEPHRINE HCL 10 MG/ML IJ SOLN
INTRAMUSCULAR | Status: DC | PRN
  Administered 2017-10-28: 100 ug via INTRAVENOUS

## 2017-10-28 NOTE — Consult Note (Signed)
     Urology Consult  I have been asked to see the patient by Dr. Jerelyn Charles, for evaluation and management of gross hematuria.  Chief Complaint: Blood in urine  History of Present Illness: Kyle Barron is a 84 y.o. year old male seen in the ED on 10/27/2017 for symptomatic anemia and melena.  He underwent upper endoscopy today and on his first void after the procedure his family noted and of stream gross hematuria.  No clots were noted.  I spoke with his family and he has previously been seen by Dr. Yves Dill for BPH.  He has had a previous cystoscopy and underwent TUMT approximately 2 years ago.  No recent episodes gross hematuria.  His family has not noted any bothersome lower urinary tract symptoms.  Past Medical History:  Diagnosis Date  . Alzheimer disease ([place])   . Hypertension     History reviewed. No pertinent surgical history.  Home Medications:  Current Meds  Medication Sig  . aspirin EC 81 MG tablet Take 81 mg by mouth at bedtime.  . Cholecalciferol (VITAMIN D3) 2000 units capsule Take 2,000 Units by mouth daily.  . divalproex (DEPAKOTE) 250 MG DR tablet Take 250 mg by mouth at bedtime.  Marland Kitchen levothyroxine (SYNTHROID, LEVOTHROID) 100 MCG tablet Take 100 mcg by mouth daily.  . memantine (NAMENDA) 10 MG tablet Take 10 mg by mouth 2 (two) times daily.  . metoprolol succinate (TOPROL-XL) 25 MG 24 hr tablet Take 25 mg by mouth daily.  . mirtazapine (REMERON) 15 MG tablet Take 15 mg by mouth at bedtime.  . vitamin B-12 (CYANOCOBALAMIN) 1000 MCG tablet Take 1,000 mcg by mouth daily.    Allergies:  Allergies  Allergen Reactions  . Codeine Nausea And Vomiting  . Meperidine Other (See Comments) and Nausea And Vomiting    Caused vomiting & altered mental status  . Warfarin Other (See Comments)    Caused gastrointestinal bleeding- anemia    History reviewed. No pertinent family history.  Social History:  reports that he has quit smoking. He has never used smokeless tobacco.  His alcohol and drug histories are not on file.  ROS: A complete review of systems was performed.  All systems are negative except for pertinent findings as noted.  Physical Exam:  Vital signs in last 24 hours: Temp:  [97.1 F (36.2 C)-98.5 F (36.9 C)] 97.1 F (36.2 C) (10/01 1359) Pulse Rate:  [46-86] 81 (10/01 1359) Resp:  [15-25] 16 (10/01 1136) BP: (92-155)/(47-88) 92/47 (10/01 1359) SpO2:  [84 %-100 %] 100 % (10/01 1359) Constitutional: Patient sleeping, No acute distress Respiratory: Normal respiratory effort, lungs clear bilaterally   Impression/Assessment:  84 year old male with painless end stream hematuria.  Recommendation:  1.  Monitor voided urines 2.  If any voiding difficulties noted check bladder scan for residual and catheter placement for clot retention 3.  Recommend outpatient hematuria evaluation to include upper tract imaging and cystoscopy.  He is an established patient of Dr. Yves Dill and can follow-up with him or will be happy to see in our office. 4.  Will continue to follow.   10/28/2017, 5:31 PM  John Giovanni,  MD

## 2017-10-28 NOTE — Consult Note (Signed)
Vonda Antigua, MD [address] [place], [place], [place], [address] [address], [place], [place], [place], [address] Phone: [phone]  Fax: [phone]  Consultation  Referring Provider:     Dr. Jerelyn Charles Primary Care Physician:  Dion Body, MD Reason for Consultation:     Melena  Date of Admission:  10/27/2017 Date of Consultation:  10/28/2017         HPI:   Kyle Barron is a 84 y.o. male reports history of intermittent melena, states yesterday he saw dark tarry looking bowel movement.  Also reports some weakness.  No nausea or vomiting.  No weight loss.  No abdominal pain.  Is on aspirin once daily.  No other NSAIDs.  Has history of chronic A. fib but not on any antiplatelet or anticoagulation.  Noted to be anemic on admission to the ER.  Patient had EGD in 2014 for melena which showed grade C esophagitis and gastritis and duodenitis.  Colonoscopy in 2005 done for hematochezia showed internal hemorrhoids and was otherwise normal.  Past Medical History:  Diagnosis Date  . Alzheimer disease (Rafael Hernandez)   . Hypertension     History reviewed. No pertinent surgical history.  Prior to Admission medications   Medication Sig Start Date End Date Taking? Authorizing Provider  aspirin EC 81 MG tablet Take 81 mg by mouth at bedtime.   Yes [provider]  Cholecalciferol (VITAMIN D3) 2000 units capsule Take 2,000 Units by mouth daily.   Yes [provider]  divalproex (DEPAKOTE) 250 MG DR tablet Take 250 mg by mouth at bedtime. 08/08/17  Yes [provider]  levothyroxine (SYNTHROID, LEVOTHROID) 100 MCG tablet Take 100 mcg by mouth daily. 09/19/17  Yes [provider]  memantine (NAMENDA) 10 MG tablet Take 10 mg by mouth 2 (two) times daily. 10/18/17  Yes [provider]  metoprolol succinate (TOPROL-XL) 25 MG 24 hr tablet Take 25 mg by mouth daily. 10/21/17  Yes [provider]  mirtazapine (REMERON) 15 MG tablet Take  15 mg by mouth at bedtime. 08/18/17  Yes [provider]  vitamin B-12 (CYANOCOBALAMIN) 1000 MCG tablet Take 1,000 mcg by mouth daily.   Yes [provider]    History reviewed. No pertinent family history.   Social History   Tobacco Use  . Smoking status: Former Research scientist (life sciences)  . Smokeless tobacco: Never Used  Substance Use Topics  . Alcohol use: Not on file  . Drug use: Not on file    Allergies as of 10/27/2017 - Review Complete 10/27/2017  Allergen Reaction Noted  . Codeine Nausea And Vomiting 12/17/2016  . Meperidine Other (See Comments) and Nausea And Vomiting 10/27/2017  . Warfarin Other (See Comments) 12/17/2016    Review of Systems:    All systems reviewed and negative except where noted in HPI.   Physical Exam:  Vital signs in last 24 hours: Vitals:   10/28/17 0439 10/28/17 0918 10/28/17 1108 10/28/17 1136  BP: (!) 118/58  108/67 112/68  Pulse: 86 71 (!) 46 71  Resp: 20  18 16   Temp: 98.1 F (36.7 C)  97.8 F (36.6 C) 98 F (36.7 C)  TempSrc: Oral  Oral Oral  SpO2: 100% 100% (!) 88% 100%  Weight:      Height:       Last BM Date: 10/27/17 General:   Pleasant, cooperative in NAD Head:  Normocephalic and atraumatic. Eyes:   No icterus.   Conjunctiva pink. PERRLA. Ears:  Normal auditory  acuity. Neck:  Supple; no masses or thyroidomegaly Lungs: Respirations even and unlabored. Lungs clear to auscultation bilaterally.   No wheezes, crackles, or rhonchi.  Abdomen:  Soft, nondistended, nontender. Normal bowel sounds. No appreciable masses or hepatomegaly.  No rebound or guarding.  Neurologic:  Alert and oriented x3;  grossly normal neurologically. Skin:  Intact without significant lesions or rashes. Cervical Nodes:  No significant cervical adenopathy. Psych:  Alert and cooperative. Normal affect.  LAB RESULTS: Recent Labs    10/27/17 1655 10/28/17 0357  WBC 7.6 6.4  HGB 5.6* 6.6*  HCT 17.9* 20.0*  PLT 280 199   BMET Recent Labs     10/27/17 1655  NA 140  K 4.4  CL 106  CO2 22  GLUCOSE 115*  BUN 28*  CREATININE 1.19  CALCIUM 9.3   LFT Recent Labs    10/27/17 1655  PROT 7.2  ALBUMIN 3.5  AST 18  ALT 9  ALKPHOS 68  BILITOT 0.6   PT/INR No results for input(s): LABPROT, INR in the last 72 hours.  STUDIES: No results found.    Impression / Plan:   Kyle Barron is a 83 y.o. y/o male with melena  History of melena in 2014 with grade C esophagitis noted at that time We will plan for EGD and/or enteroscopy today for further evaluation PPI IV twice daily  Continue serial CBCs and transfuse PRN Avoid NSAIDs Maintain 2 large-bore IV lines Please page GI with any acute hemodynamic changes, or signs of active GI bleeding  I have discussed alternative options, risks & benefits,  which include, but are not limited to, bleeding, infection, perforation,respiratory complication & drug reaction.  The patient agrees with this plan & written consent will be obtained.    Procedures discussed with patient and family in detail and they are in agreement with proceeding.  I also discussed that if his EGD is negative, enteroscopy would then allow [place] to evaluate more distally into the small bowel to rule out any AVMs.  They have consented both with an EGD and enteroscopy.  Given that his EGD in 2014 showed grade C esophagitis, and no other sources of melena at that time, it would be useful to evaluate more distally into the small bowel if his EGD is negative.  Thank you for involving me in the care of this patient.      LOS: 1 day   Virgel Manifold, MD  10/28/2017, 1:05 PM

## 2017-10-28 NOTE — Transfer of Care (Signed)
Immediate Anesthesia Transfer of Care Note  Patient: Kyle Barron  Procedure(s) Performed: ESOPHAGOGASTRODUODENOSCOPY (EGD) (N/A ) ENTEROSCOPY  Patient Location: PACU  Anesthesia Type:General  Level of Consciousness: sedated  Airway & Oxygen Therapy: Patient Spontanous Breathing and Patient connected to nasal cannula oxygen  Post-op Assessment: Report given to RN and Post -op Vital signs reviewed and stable  Post vital signs: Reviewed and stable  Last Vitals:  Vitals Value Taken Time  BP 92/47 10/28/2017  1:59 PM  Temp 36.2 C 10/28/2017  1:59 PM  Pulse 57 10/28/2017  1:59 PM  Resp 14 10/28/2017  1:59 PM  SpO2 100 % 10/28/2017  1:59 PM  Vitals shown include unvalidated device data.  Last Pain:  Vitals:   10/28/17 1359  TempSrc: Tympanic  PainSc: 0-No pain         Complications: No apparent anesthesia complications

## 2017-10-28 NOTE — Anesthesia Post-op Follow-up Note (Signed)
Anesthesia QCDR form completed.        

## 2017-10-28 NOTE — Progress Notes (Signed)
[facility] [place] NAME: Kyle Barron    MR#:  [id number]  DATE OF BIRTH:  07-01-1920  SUBJECTIVE:  CHIEF COMPLAINT:   Chief Complaint  Patient presents with  . Anemia  Patient without complaint, gastroenterology to see with plans for possible endoscopy given complaints of melena  REVIEW OF SYSTEMS:  CONSTITUTIONAL: No fever, fatigue or weakness.  EYES: No blurred or double vision.  EARS, NOSE, AND THROAT: No tinnitus or ear pain.  RESPIRATORY: No cough, shortness of breath, wheezing or hemoptysis.  CARDIOVASCULAR: No chest pain, orthopnea, edema.  GASTROINTESTINAL: No nausea, vomiting, diarrhea or abdominal pain.  GENITOURINARY: No dysuria, hematuria.  ENDOCRINE: No polyuria, nocturia,  HEMATOLOGY: No anemia, easy bruising or bleeding SKIN: No rash or lesion. MUSCULOSKELETAL: No joint pain or arthritis.   NEUROLOGIC: No tingling, numbness, weakness.  PSYCHIATRY: No anxiety or depression.   ROS  DRUG ALLERGIES:   Allergies  Allergen Reactions  . Codeine Nausea And Vomiting  . Meperidine Other (See Comments) and Nausea And Vomiting    Caused vomiting & altered mental status  . Warfarin Other (See Comments)    Caused gastrointestinal bleeding- anemia    VITALS:  Blood pressure 112/68, pulse 71, temperature 98 F (36.7 C), temperature source Oral, resp. rate 16, height 5\' 11"  (1.803 m), weight 72.6 kg, SpO2 100 %.  PHYSICAL EXAMINATION:  GENERAL:  84 y.o.-year-old patient lying in the bed with no acute distress.  EYES: Pupils equal, round, reactive to light and accommodation. No scleral icterus. Extraocular muscles intact.  HEENT: Head atraumatic, normocephalic. Oropharynx and nasopharynx clear.  NECK:  Supple, no jugular venous distention. No thyroid enlargement, no tenderness.  LUNGS: Normal breath sounds bilaterally, no wheezing, rales,rhonchi or crepitation. No use of accessory muscles of respiration.  CARDIOVASCULAR:  S1, S2 normal. No murmurs, rubs, or gallops.  ABDOMEN: Soft, nontender, nondistended. Bowel sounds present. No organomegaly or mass.  EXTREMITIES: No pedal edema, cyanosis, or clubbing.  NEUROLOGIC: Cranial nerves II through XII are intact. Muscle strength 5/5 in all extremities. Sensation intact. Gait not checked.  PSYCHIATRIC: The patient is alert and oriented x 3.  SKIN: No obvious rash, lesion, or ulcer.   Physical Exam LABORATORY PANEL:   CBC Recent Labs  Lab 10/28/17 0357  WBC 6.4  HGB 6.6*  HCT 20.0*  PLT 199   ------------------------------------------------------------------------------------------------------------------  Chemistries  Recent Labs  Lab 10/27/17 1655  NA 140  K 4.4  CL 106  CO2 22  GLUCOSE 115*  BUN 28*  CREATININE 1.19  CALCIUM 9.3  AST 18  ALT 9  ALKPHOS 68  BILITOT 0.6   ------------------------------------------------------------------------------------------------------------------  Cardiac Enzymes No results for input(s): TROPONINI in the last 168 hours. ------------------------------------------------------------------------------------------------------------------  RADIOLOGY:  No results found.  ASSESSMENT AND PLAN:  Kyle Barron  is a 84 y.o. male with a known history of gastritis, Alzheimer's disease, hypertension, chronic a fib on only aspirin comes to the emergency room accompanied by daughter who mentioned patient has been feeling tired, fatigue, short of breath and looking pale lately. he was found to have hemoglobin of 4.9 at primary care's office. Hemoglobin was 5.6 here in the ER  *Acute melena  Stable  H&H 6.6 up from 5.6 Transfuse another unit packed red blood cells, gastroenterology to see, continue Protonix, H&H every 6 hours, CBC daily, transfuse as needed   * history of chronic a fib on chronic baby aspirin Aspirin held at this time  *Chronic dementia Stable Continue  increased nursing care PRN,  aspiration/fall/skin care precautions while in house  Disposition pending clinical course in 1 to 2 days  All the records are reviewed and case discussed with Care Management/Social Workerr. Management plans discussed with the patient, family and they are in agreement.  CODE STATUS: dnr  TOTAL TIME TAKING CARE OF THIS PATIENT: 40 minutes.     POSSIBLE D/C IN 1-2 DAYS, DEPENDING ON CLINICAL CONDITION.   Kyle Barron Kyle Barron M.D on 10/28/2017   Between 7am to 6pm - Pager - [phone]  After 6pm go to [email or web address] - password [email or web address] [facility]  Office  [phone]  CC: Primary care physician; Dion Body, MD  Note: This dictation was prepared with Dragon dictation along with smaller phrase technology. Any transcriptional errors that result from this process are unintentional.

## 2017-10-28 NOTE — Anesthesia Procedure Notes (Signed)
Date/Time: 10/28/2017 1:15 PM Performed by: Johnna Acosta, CRNA Pre-anesthesia Checklist: Patient identified, Emergency Drugs available, Suction available, Patient being monitored and Timeout performed Patient Re-evaluated:Patient Re-evaluated prior to induction Oxygen Delivery Method: Nasal cannula Preoxygenation: Pre-oxygenation with 100% oxygen Induction Type: IV induction

## 2017-10-28 NOTE — Op Note (Addendum)
[facility] Patient Name: Kyle Barron Procedure Date: 10/28/2017 1:16 PM MRN: [id number] Account #: [id number] Date of Birth: [birth year] Admit Type: Inpatient Age: 84 Room: [facility] ENDO ROOM 1 Gender: Male Note Status: Finalized Procedure:            Upper GI endoscopy Indications:          Melena Providers:            Shaylon Aden B. Bonna Gains MD, MD Referring MD:         Dion Body (Referring MD) Medicines:            Monitored Anesthesia Care Complications:        No immediate complications. Procedure:            Pre-Anesthesia Assessment:                       - Prior to the procedure, a History and Physical was                        performed, and patient medications, allergies and                        sensitivities were reviewed. The patient's tolerance of                        previous anesthesia was reviewed.                       - The risks and benefits of the procedure and the                        sedation options and risks were discussed with the                        patient. All questions were answered and informed                        consent was obtained.                       - Patient identification and proposed procedure were                        verified prior to the procedure by the physician, the                        nurse, the anesthesiologist, the anesthetist and the                        technician. The procedure was verified in the procedure                        room.                       - ASA Grade Assessment: III - A patient with severe                        systemic disease.                       After obtaining informed  consent, the endoscope was                        passed under direct vision. Throughout the procedure,                        the patient's blood pressure, pulse, and oxygen                        saturations were monitored continuously. The Endoscope   was introduced through the mouth, and advanced to the                        second part of duodenum. The upper GI endoscopy was                        accomplished with ease. The patient tolerated the                        procedure well. Findings:      Patchy, white plaques were found in the mid esophagus and in the distal       esophagus. Brushings for KOH prep were obtained in the mid esophagus and       in the distal esophagus.      There were esophageal mucosal changes classified as Barrett's stage       C0-M1 per Prague criteria present in the distal esophagus. The maximum       longitudinal extent of these mucosal changes was 1 cm in length.       Biopsies were not done due to patient's admission for melena and anemia.      Patchy mildly erythematous mucosa without bleeding was found in the       gastric antrum.      The duodenal bulb, second portion of the duodenum and examined duodenum       were normal. Impression:           Biopsies not done due to patient's anemia and melena on                        this admission.                       - Esophageal plaques were found, suspicious for                        candidiasis. Brushings performed.                       - Esophageal mucosal changes classified as Barrett's                        stage C0-M1 per Prague criteria.                       - Erythematous mucosa in the antrum.                       - Normal duodenal bulb, second portion of the duodenum                        and examined duodenum. Recommendation:       -  Perform an H. pylori serology.                       - Patient has a contact number available for                        emergencies. The signs and symptoms of potential                        delayed complications were discussed with the patient.                        Return to normal activities tomorrow. Written discharge                        instructions were provided to the patient.                        - The findings and recommendations were discussed with                        the patient.                       - The findings and recommendations were discussed with                        the patient's family.                       - Treat with fluconazole if brushings show yeast.                       - Proceed with enteroscopy today                       - Continue Serial CBCs and transfuse PRN                       - Follow an antireflux regimen. Procedure Code(s):    --- Professional ---                       [phone], Esophagogastroduodenoscopy, flexible, transoral;                        diagnostic, including collection of specimen(s) by                        brushing or washing, when performed (separate procedure) Diagnosis Code(s):    --- Professional ---                       K22.9, Disease of esophagus, unspecified                       K22.70, Barrett's esophagus without dysplasia                       K31.89, Other diseases of stomach and duodenum                       K92.1, Melena (includes Hematochezia) CPT copyright 2017 American Medical Association. All rights reserved. The codes  documented in this report are preliminary and upon coder review may  be revised to meet current compliance requirements.  Vonda Antigua, MD Margretta Sidle B. Bonna Gains MD, MD 10/28/2017 1:38:01 PM This report has been signed electronically. Number of Addenda: 0 Note Initiated On: 10/28/2017 1:16 PM Estimated Blood Loss: Estimated blood loss: none.      Sarah Bush [facility]

## 2017-10-28 NOTE — Anesthesia Preprocedure Evaluation (Signed)
Anesthesia Evaluation  Patient identified by MRN, date of birth, ID band Patient awake    Reviewed: Allergy & Precautions, H&P , NPO status , Patient's Chart, lab work & pertinent test results, reviewed documented beta blocker date and time   Airway Mallampati: II   Neck ROM: full    Dental  (+) Poor Dentition   Pulmonary neg pulmonary ROS, former smoker,    Pulmonary exam normal        Cardiovascular Exercise Tolerance: Poor hypertension, On Medications negative cardio ROS Normal cardiovascular exam Rhythm:regular Rate:Normal     Neuro/Psych PSYCHIATRIC DISORDERS Dementia negative neurological ROS  negative psych ROS   GI/Hepatic negative GI ROS, Neg liver ROS,   Endo/Other  negative endocrine ROS  Renal/GU negative Renal ROS  negative genitourinary   Musculoskeletal   Abdominal   Peds  Hematology negative hematology ROS (+)   Anesthesia Other Findings Past Medical History: No date: Alzheimer disease No date: Hypertension History reviewed. No pertinent surgical history. BMI    Body Mass Index:  22.32 kg/m     Reproductive/Obstetrics negative OB ROS                             Anesthesia Physical Anesthesia Plan  ASA: III and emergent  Anesthesia Plan: General   Post-op Pain Management:    Induction:   PONV Risk Score and Plan:   Airway Management Planned:   Additional Equipment:   Intra-op Plan:   Post-operative Plan:   Informed Consent: I have reviewed the patients History and Physical, chart, labs and discussed the procedure including the risks, benefits and alternatives for the proposed anesthesia with the patient or authorized representative who has indicated his/her understanding and acceptance.   Dental Advisory Given  Plan Discussed with: CRNA  Anesthesia Plan Comments:         Anesthesia Quick Evaluation

## 2017-10-28 NOTE — Op Note (Signed)
[facility] Gastroenterology Patient Name: Kyle Barron Procedure Date: 10/28/2017 1:40 PM MRN: [id number] Account #: [id number] Date of Birth: 12-Jul-1920 Admit Type: Inpatient Age: 84 Room: [facility] ENDO ROOM 1 Gender: Male Note Status: Finalized Procedure:            Small bowel enteroscopy Indications:          Melena Providers:            Araya Roel B. Bonna Gains MD, MD Referring MD:         Dion Body (Referring MD) Medicines:            Monitored Anesthesia Care Complications:        No immediate complications. Procedure:            Pre-Anesthesia Assessment:                       - Prior to the procedure, a History and Physical was                        performed, and patient medications, allergies and                        sensitivities were reviewed. The patient's tolerance of                        previous anesthesia was reviewed.                       - The risks and benefits of the procedure and the                        sedation options and risks were discussed with the                        patient. All questions were answered and informed                        consent was obtained.                       - Patient identification and proposed procedure were                        verified prior to the procedure by the physician, the                        nurse, the anesthesiologist, the anesthetist and the                        technician. The procedure was verified in the procedure                        room.                       - ASA Grade Assessment: III - A patient with severe                        systemic disease.                       - Prior to  the procedure, a History and Physical was                        performed, and patient medications and allergies were                        reviewed. The patient is competent. The risks and                        benefits of the procedure and the sedation options and        risks were discussed with the patient. All questions                        were answered and informed consent was obtained.                        Patient identification and proposed procedure were                        verified by the physician, the nurse, the                        anesthesiologist, the anesthetist and the technician in                        the pre-procedure area in the procedure room in the                        endoscopy suite. Mental Status Examination: alert and                        oriented. Airway Examination: normal oropharyngeal                        airway and neck mobility. Respiratory Examination:                        clear to auscultation. CV Examination: normal.                        Prophylactic Antibiotics: The patient does not require                        prophylactic antibiotics. Prior Anticoagulants: The                        patient has taken no previous anticoagulant or                        antiplatelet agents. ASA Grade Assessment: III - A                        patient with severe systemic disease. After reviewing                        the risks and benefits, the patient was deemed in                        satisfactory condition to undergo the procedure. The  anesthesia plan was to use monitored anesthesia care                        (MAC). Immediately prior to administration of                        medications, the patient was re-assessed for adequacy                        to receive sedatives. The heart rate, respiratory rate,                        oxygen saturations, blood pressure, adequacy of                        pulmonary ventilation, and response to care were                        monitored throughout the procedure. The physical status                        of the patient was re-assessed after the procedure.                       After obtaining informed consent, the endoscope was                         passed under direct vision. Throughout the procedure,                        the patient's blood pressure, pulse, and oxygen                        saturations were monitored continuously. The                        Enteroscope was introduced through the mouth and                        advanced to the jejunum, to the 200 cm mark (from the                        incisors). The small bowel enteroscopy was accomplished                        with ease. The patient tolerated the procedure well. Findings:      There were esophageal mucosal changes classified as Barrett's stage       C0-M1 per Prague criteria present in the distal esophagus. The maximum       longitudinal extent of these mucosal changes was 1 cm in length.      Patchy mildly erythematous mucosa without bleeding was found in the       gastric antrum.      There was no evidence of significant pathology in the entire examined       duodenum.      There was no evidence of significant pathology in the entire examined       portion of jejunum. Impression:           - Esophageal mucosal changes classified as Barrett's  stage C0-M1 per Prague criteria.                       - Erythematous mucosa in the antrum.                       - Normal examined duodenum.                       - The examined portion of the jejunum was normal.                       - No specimens collected. Recommendation:       - Perform a colonoscopy tomorrow.                       - Continue Serial CBCs and transfuse PRN                       - Discontinue IV PPI                       Ok to continue low dose protonix 46m once daily (due                        to Barrett's and gastric erythema noted) if no                        contraindications                       - The findings and recommendations were discussed with                        the patient.                       - The findings and recommendations were  discussed with                        the patient's family. Procedure Code(s):    --- Professional ---                       [id number] Small intestinal endoscopy, enteroscopy beyond                        second portion of duodenum, not including ileum;                        diagnostic, including collection of specimen(s) by                        brushing or washing, when performed (separate procedure) Diagnosis Code(s):    --- Professional ---                       K22.70, Barrett's esophagus without dysplasia                       K31.89, Other diseases of stomach and duodenum                       K92.1, Melena (includes Hematochezia) CPT copyright 2017  American Medical Association. All rights reserved. The codes documented in this report are preliminary and upon coder review may  be revised to meet current compliance requirements.  Vonda Antigua, MD Margretta Sidle B. Bonna Gains MD, MD 10/28/2017 1:57:58 PM This report has been signed electronically. Number of Addenda: 0 Note Initiated On: 10/28/2017 1:40 PM      [facility]

## 2017-10-29 ENCOUNTER — Encounter: Payer: Self-pay | Admitting: Gastroenterology

## 2017-10-29 LAB — TYPE AND SCREEN
ABO/RH(D): O POS
Antibody Screen: NEGATIVE
UNIT DIVISION: 0
UNIT DIVISION: 0
UNIT DIVISION: 0

## 2017-10-29 LAB — CBC
HEMATOCRIT: 25.3 % — AB (ref 40.0–52.0)
Hemoglobin: 8.4 g/dL — ABNORMAL LOW (ref 13.0–18.0)
MCH: 25.7 pg — ABNORMAL LOW (ref 26.0–34.0)
MCHC: 33.1 g/dL (ref 32.0–36.0)
MCV: 77.6 fL — AB (ref 80.0–100.0)
Platelets: 208 10*3/uL (ref 150–440)
RBC: 3.25 MIL/uL — ABNORMAL LOW (ref 4.40–5.90)
RDW: 20.4 % — AB (ref 11.5–14.5)
WBC: 7.2 10*3/uL (ref 3.8–10.6)

## 2017-10-29 LAB — BASIC METABOLIC PANEL
ANION GAP: 6 (ref 5–15)
BUN: 20 mg/dL (ref 8–23)
CALCIUM: 8.1 mg/dL — AB (ref 8.9–10.3)
CO2: 25 mmol/L (ref 22–32)
Chloride: 106 mmol/L (ref 98–111)
Creatinine, Ser: 1.06 mg/dL (ref 0.61–1.24)
GFR calc Af Amer: >60 mL/min (ref >60)
GFR calc non Af Amer: 58 mL/min — ABNORMAL LOW (ref >60)
GLUCOSE: 87 mg/dL (ref 70–99)
Potassium: 3.5 mmol/L (ref 3.5–5.1)
Sodium: 137 mmol/L (ref 135–145)

## 2017-10-29 LAB — BPAM RBC
BLOOD PRODUCT EXPIRATION DATE: 201910222359
Blood Product Expiration Date: 201910222359
Blood Product Expiration Date: 201910232359
ISSUE DATE / TIME: 201910011113
ISSUE DATE / TIME: 201909301916
ISSUE DATE / TIME: 201909302052
UNIT TYPE AND RH: 5100
Unit Type and Rh: 5100
Unit Type and Rh: 5100

## 2017-10-29 LAB — PROTIME-INR
INR: 1.17
Prothrombin Time: 14.8 seconds (ref 11.4–15.2)

## 2017-10-29 MED ORDER — PANTOPRAZOLE SODIUM 40 MG PO TBEC: 40.0000 mg | DELAYED_RELEASE_TABLET | Freq: Every day | ORAL | Status: DC

## 2017-10-29 MED ORDER — PANTOPRAZOLE SODIUM 20 MG PO TBEC
20.0000 mg | DELAYED_RELEASE_TABLET | Freq: Every day | ORAL | Status: DC
  Administered 2017-10-30 – 2017-10-31 (×2): 20 mg via ORAL
  Filled 2017-10-29 (×3): qty 1

## 2017-10-29 MED ORDER — SODIUM CHLORIDE 0.9 % IV SOLN
INTRAVENOUS | Status: DC
  Administered 2017-10-30: 05:00:00 via INTRAVENOUS

## 2017-10-29 NOTE — Progress Notes (Addendum)
[place] at [place] NAME: Kyle Barron    MR#:  [id number]  DATE OF BIRTH:  April 20, 1920  SUBJECTIVE:  CHIEF COMPLAINT:   Chief Complaint  Patient presents with  . Anemia  Patient without complaint, hematuria has resolved, urology and gastroenterology input appreciated, for colonoscopy on tomorrow REVIEW OF SYSTEMS:  CONSTITUTIONAL: No fever, fatigue or weakness.  EYES: No blurred or double vision.  EARS, NOSE, AND THROAT: No tinnitus or ear pain.  RESPIRATORY: No cough, shortness of breath, wheezing or hemoptysis.  CARDIOVASCULAR: No chest pain, orthopnea, edema.  GASTROINTESTINAL: No nausea, vomiting, diarrhea or abdominal pain.  GENITOURINARY: No dysuria, hematuria.  ENDOCRINE: No polyuria, nocturia,  HEMATOLOGY: No anemia, easy bruising or bleeding SKIN: No rash or lesion. MUSCULOSKELETAL: No joint pain or arthritis.   NEUROLOGIC: No tingling, numbness, weakness.  PSYCHIATRY: No anxiety or depression.   ROS  DRUG ALLERGIES:   Allergies  Allergen Reactions  . Codeine Nausea And Vomiting  . Meperidine Other (See Comments) and Nausea And Vomiting    Caused vomiting & altered mental status  . Warfarin Other (See Comments)    Caused gastrointestinal bleeding- anemia    VITALS:  Blood pressure 135/78, pulse 71, temperature 98 F (36.7 C), temperature source Oral, resp. rate 16, height 5\' 11"  (1.803 m), weight 72.6 kg, SpO2 96 %.  PHYSICAL EXAMINATION:  GENERAL:  84 y.o.-year-old patient lying in the bed with no acute distress.  EYES: Pupils equal, round, reactive to light and accommodation. No scleral icterus. Extraocular muscles intact.  HEENT: Head atraumatic, normocephalic. Oropharynx and nasopharynx clear.  NECK:  Supple, no jugular venous distention. No thyroid enlargement, no tenderness.  LUNGS: Normal breath sounds bilaterally, no wheezing, rales,rhonchi or crepitation. No use of accessory muscles of respiration.   CARDIOVASCULAR: S1, S2 normal. No murmurs, rubs, or gallops.  ABDOMEN: Soft, nontender, nondistended. Bowel sounds present. No organomegaly or mass.  EXTREMITIES: No pedal edema, cyanosis, or clubbing.  NEUROLOGIC: Cranial nerves II through XII are intact. Muscle strength 5/5 in all extremities. Sensation intact. Gait not checked.  PSYCHIATRIC: The patient is alert and oriented x 3.  SKIN: No obvious rash, lesion, or ulcer.   Physical Exam LABORATORY PANEL:   CBC Recent Labs  Lab 10/29/17 0437  WBC 7.2  HGB 8.4*  HCT 25.3*  PLT 208   ------------------------------------------------------------------------------------------------------------------  Chemistries  Recent Labs  Lab 10/27/17 1655 10/29/17 0437  NA 140 137  K 4.4 3.5  CL 106 106  CO2 22 25  GLUCOSE 115* 87  BUN 28* 20  CREATININE 1.19 1.06  CALCIUM 9.3 8.1*  AST 18  --   ALT 9  --   ALKPHOS 68  --   BILITOT 0.6  --    ------------------------------------------------------------------------------------------------------------------  Cardiac Enzymes No results for input(s): TROPONINI in the last 168 hours. ------------------------------------------------------------------------------------------------------------------  RADIOLOGY:  No results found.  ASSESSMENT AND PLAN:  Kyle Barron  is a 84 y.o. male with a known history of gastritis, Alzheimer's disease, hypertension, chronic a fib on only aspirin comes to the emergency room accompanied by daughter who mentioned patient has been feeling tired, fatigue, short of breath and looking pale lately. he was found to have hemoglobin of 4.9 at primary care's office. Hemoglobin was 5.6 here in the ER  *Acute melena  Stable  H&H 8.4 up from 6.6 EGD done October 28, 2017 noted for Barrett's esophagus/erythema in the antrum, for colonoscopy on tomorrow per gastroenterology, change Protonix to p.o.,  CBC daily and transfuse as needed   * history of chronic  a fib on chronic baby aspirin Continue to hold aspirin  *Acute hematuria Etiology unknown Resolved Urology input appreciated, to follow-up with Dr. Eliberto Ivory for outpatient work-up  *Chronic dementia Stable Continue increased nursing care PRN, aspiration/fall/skin care precautions while in house  Disposition pending clinical course in 1 to 2 days barring any complications  All the records are reviewed and case discussed with Care Management/Social Workerr. Management plans discussed with the patient, family and they are in agreement.  CODE STATUS: dnr  TOTAL TIME TAKING CARE OF THIS PATIENT: 40 minutes.     POSSIBLE D/C IN 1-2 DAYS, DEPENDING ON CLINICAL CONDITION.   Kyle Barron Salary M.D on 10/29/2017   Between 7am to 6pm - Pager - [phone]  After 6pm go to [email or web address] - password [email or web address] [place]  Office  [phone]  CC: Primary care physician; Kyle Body, MD  Note: This dictation was prepared with Dragon dictation along with smaller phrase technology. Any transcriptional errors that result from this process are unintentional.

## 2017-10-29 NOTE — Progress Notes (Signed)
   Vonda Antigua, MD [address], [place], [place], [place], [address] [address], [place], [place], [place], [address] Phone: [phone]  Fax: [phone]   Subjective: Patient did not complete his prep yesterday.  Hemoglobin stable at 8.4 today after PRBC transfusions yesterday. Denies any further melena.   Objective: Exam: Vital signs in last 24 hours: Vitals:   10/28/17 1359 10/28/17 1740 10/28/17 2048 10/29/17 0433  BP: (!) 92/47 (!) 105/52 115/69 135/78  Pulse: 81 71 76 71  Resp:  16 (!) 24 16  Temp: (!) 97.1 F (36.2 C) 98.6 F (37 C) 98.4 F (36.9 C) 98 F (36.7 C)  TempSrc: Tympanic Oral Oral Oral  SpO2: 100% 100% 98% 96%  Weight:      Height:       Weight change:   Intake/Output Summary (Last 24 hours) at 10/29/2017 1037 Last data filed at 10/29/2017 1031 Gross per 24 hour  Intake 889.29 ml  Output 950 ml  Net -60.71 ml    General: No acute distress, AAO x3 Abd: Soft, NT/ND, No HSM Skin: Warm, no rashes Neck: Supple, Trachea midline   Lab Results: Lab Results  Component Value Date   WBC 7.2 10/29/2017   HGB 8.4 (L) 10/29/2017   HCT 25.3 (L) 10/29/2017   MCV 77.6 (L) 10/29/2017   PLT 208 10/29/2017   Micro Results: Recent Results (from the past 240 hour(s))  KOH prep     Status: None   Collection Time: 10/28/17  1:33 PM  Result Value Ref Range Status   Specimen Description ESOPHAGUS  Final   Special Requests NONE  Final   KOH Prep   Final    NO YEAST OR FUNGAL ELEMENTS SEEN Performed at [facility], [address] [place], [place] [address]    Report Status 10/28/2017 FINAL  Final   Studies/Results: No results found. Medications:  Scheduled Meds: . pantoprazole (PROTONIX) IV  40 mg Intravenous Q12H   Continuous Infusions: . sodium chloride     PRN Meds:.acetaminophen **OR** acetaminophen   Assessment: Active Problems:   GI bleed   Esophageal lesion   Barrett's esophagus without dysplasia   Stomach  irritation   Melena    Plan: We will plan on colonoscopy tomorrow since patient did not complete prep yesterday, to evaluate for any right-sided colonic lesions that could be causing patient's anemia and melena If colonoscopy is negative, small bowel capsule can be scheduled as well  EGD and enteroscopy unrevealing for any etiology of patient's anemia and melena Continue serial CBCs and transfuse PRN Avoid NSAIDs in this elderly gentleman   LOS: 2 days   Vonda Antigua, MD 10/29/2017, 10:37 AM

## 2017-10-29 NOTE — Progress Notes (Signed)
Urology Consult Follow Up  Subjective: Patient sleeping this morning.  Spoke with his nurse and he voided late last night/early this morning and had pink-tinged urine.  Anti-infectives: Anti-infectives (From admission, onward)   None      Current Facility-Administered Medications  Medication Dose Route Frequency Provider Last Rate Last Dose  . acetaminophen (TYLENOL) tablet 650 mg  650 mg Oral Q6H PRN Fritzi Mandes, MD       Or  . acetaminophen (TYLENOL) suppository 650 mg  650 mg Rectal Q6H PRN Fritzi Mandes, MD      . pantoprazole (PROTONIX) injection 40 mg  40 mg Intravenous Q12H Fritzi Mandes, MD   40 mg at 10/28/17 2200     Objective: Vital signs in last 24 hours: Temp:  [97.1 F (36.2 C)-98.6 F (37 C)] 98 F (36.7 C) (10/02 0433) Pulse Rate:  [46-81] 71 (10/02 0433) Resp:  [16-24] 16 (10/02 0433) BP: (92-135)/(47-78) 135/78 (10/02 0433) SpO2:  [88 %-100 %] 96 % (10/02 0433)  Intake/Output from previous day: 10/01 0701 - 10/02 0700 In: 529.3 [I.V.:202.6; Blood:326.7] Out: 1000 [Urine:1000] Intake/Output this shift: No intake/output data recorded.   Physical Exam deferred  Lab Results:  Recent Labs    10/28/17 0357 10/29/17 0437  WBC 6.4 7.2  HGB 6.6* 8.4*  HCT 20.0* 25.3*  PLT 199 208   BMET Recent Labs    10/27/17 1655 10/29/17 0437  NA 140 137  K 4.4 3.5  CL 106 106  CO2 22 25  GLUCOSE 115* 87  BUN 28* 20  CREATININE 1.19 1.06  CALCIUM 9.3 8.1*    Assessment: Gross hematuria-resolving  Plan: Recommend outpatient follow-up with Dr. Yves Dill or [facility] for hematuria evaluation.  Will sign off.  Please call for recurrent hematuria.    LOS: 2 days    Abbie Sons 10/29/2017

## 2017-10-30 ENCOUNTER — Inpatient Hospital Stay: Payer: Medicare Other | Admitting: Anesthesiology

## 2017-10-30 ENCOUNTER — Ambulatory Visit: Payer: Medicare Other | Admitting: Physical Therapy

## 2017-10-30 ENCOUNTER — Inpatient Hospital Stay: Payer: Medicare Other

## 2017-10-30 ENCOUNTER — Encounter
Admission: EM | Disposition: A | Discharge status: Home / Self Care | Payer: Self-pay | Source: Home / Self Care | Attending: Family Medicine

## 2017-10-30 DIAGNOSIS — D62 Acute posthemorrhagic anemia: Secondary | ICD-10-CM

## 2017-10-30 DIAGNOSIS — K5520 Angiodysplasia of colon without hemorrhage: Secondary | ICD-10-CM

## 2017-10-30 DIAGNOSIS — D123 Benign neoplasm of transverse colon: Secondary | ICD-10-CM

## 2017-10-30 HISTORY — PX: COLONOSCOPY: SHX5424

## 2017-10-30 LAB — IRON AND TIBC
Iron: 11 ug/dL — ABNORMAL LOW (ref 45–182)
Saturation Ratios: 3 % — ABNORMAL LOW (ref 17.9–39.5)
TIBC: 333 ug/dL (ref 250–450)
UIBC: 322 ug/dL

## 2017-10-30 SURGERY — COLONOSCOPY
Anesthesia: General

## 2017-10-30 MED ORDER — PHENYLEPHRINE HCL 10 MG/ML IJ SOLN
INTRAMUSCULAR | Status: DC | PRN
  Administered 2017-10-30: 50 ug via INTRAVENOUS

## 2017-10-30 MED ORDER — FERROUS SULFATE 325 (65 FE) MG PO TABS
325.0000 mg | ORAL_TABLET | Freq: Two times a day (BID) | ORAL | Status: DC
  Administered 2017-10-30 – 2017-10-31 (×2): 325 mg via ORAL
  Filled 2017-10-30 (×3): qty 1

## 2017-10-30 MED ORDER — FLEET ENEMA 7-19 GM/118ML RE ENEM
2.0000 | ENEMA | Freq: Once | RECTAL | Status: AC
  Administered 2017-10-30: 2 via RECTAL

## 2017-10-30 MED ORDER — PROPOFOL 10 MG/ML IV BOLUS
INTRAVENOUS | Status: DC | PRN
  Administered 2017-10-30: 30 mg via INTRAVENOUS
  Administered 2017-10-30: 10 mg via INTRAVENOUS

## 2017-10-30 MED ORDER — LIDOCAINE HCL (CARDIAC) PF 100 MG/5ML IV SOSY
PREFILLED_SYRINGE | INTRAVENOUS | Status: DC | PRN
  Administered 2017-10-30: 60 mg via INTRAVENOUS

## 2017-10-30 MED ORDER — LIDOCAINE HCL (PF) 2 % IJ SOLN
INTRAMUSCULAR | Status: AC
  Filled 2017-10-30: qty 10

## 2017-10-30 MED ORDER — PROPOFOL 10 MG/ML IV BOLUS
INTRAVENOUS | Status: AC
  Filled 2017-10-30: qty 20

## 2017-10-30 MED ORDER — PROPOFOL 500 MG/50ML IV EMUL
INTRAVENOUS | Status: AC
  Filled 2017-10-30: qty 50

## 2017-10-30 MED ORDER — PHENOL 1.4 % MT LIQD
1.0000 | OROMUCOSAL | Status: DC | PRN
  Administered 2017-10-30: 1 via OROMUCOSAL
  Filled 2017-10-30: qty 177

## 2017-10-30 MED ORDER — PROPOFOL 500 MG/50ML IV EMUL
INTRAVENOUS | Status: DC | PRN
  Administered 2017-10-30: 120 ug/kg/min via INTRAVENOUS

## 2017-10-30 NOTE — Transfer of Care (Signed)
Immediate Anesthesia Transfer of Care Note  Patient: Kyle Barron  Procedure(s) Performed: COLONOSCOPY (N/A )  Patient Location: PACU  Anesthesia Type:General  Level of Consciousness: sedated  Airway & Oxygen Therapy: Patient Spontanous Breathing and Patient connected to nasal cannula oxygen  Post-op Assessment: Report given to RN and Post -op Vital signs reviewed and stable  Post vital signs: Reviewed and stable  Last Vitals:  Vitals Value Taken Time  BP 106/56 10/30/2017  1:22 PM  Temp 36.1 C 10/30/2017  1:22 PM  Pulse 82 10/30/2017  1:24 PM  Resp 16 10/30/2017  1:24 PM  SpO2 100 % 10/30/2017  1:24 PM  Vitals shown include unvalidated device data.  Last Pain:  Vitals:   10/30/17 1322  TempSrc: Tympanic  PainSc: 0-No pain         Complications: No apparent anesthesia complications

## 2017-10-30 NOTE — Care Management Important Message (Signed)
Copy of signed Medicare IM left in room with patient's daughter (patient out for procedure).

## 2017-10-30 NOTE — Progress Notes (Signed)
[place] at [place] NAME: Kyle Barron    MR#:  [id number]  DATE OF BIRTH:  22-Jun-1920  SUBJECTIVE:   Patient here with anemia. Had a gallon of Golytely. Two BMs. Colonoscopy plan for today. Denies any abdominal pain. Hematuria resolved. REVIEW OF SYSTEMS:   Review of Systems  Constitutional: Negative for chills, fever and weight loss.  HENT: Negative for ear discharge, ear pain and nosebleeds.   Eyes: Negative for blurred vision, pain and discharge.  Respiratory: Negative for sputum production, shortness of breath, wheezing and stridor.   Cardiovascular: Negative for chest pain, palpitations, orthopnea and PND.  Gastrointestinal: Negative for abdominal pain, diarrhea, nausea and vomiting.  Genitourinary: Negative for frequency and urgency.  Musculoskeletal: Negative for back pain and joint pain.  Neurological: Negative for sensory change, speech change, focal weakness and weakness.  Psychiatric/Behavioral: Negative for depression and hallucinations. The patient is not nervous/anxious.    Tolerating Diet:npo for colonoscopy Tolerating PT: uses walker at home`  DRUG ALLERGIES:   Allergies  Allergen Reactions  . Codeine Nausea And Vomiting  . Meperidine Other (See Comments) and Nausea And Vomiting    Caused vomiting & altered mental status  . Warfarin Other (See Comments)    Caused gastrointestinal bleeding- anemia    VITALS:  Blood pressure (!) 127/57, pulse 71, temperature 98 F (36.7 C), temperature source Oral, resp. rate 18, height 5\' 11"  (1.803 m), weight 72.6 kg, SpO2 97 %.  PHYSICAL EXAMINATION:   Physical Exam  GENERAL:  84 y.o.-year-old patient lying in the bed with no acute distress.  EYES: Pupils equal, round, reactive to light and accommodation. No scleral icterus. Extraocular muscles intact.  HEENT: Head atraumatic, normocephalic. Oropharynx and nasopharynx clear.  NECK:  Supple, no jugular venous  distention. No thyroid enlargement, no tenderness.  LUNGS: Normal breath sounds bilaterally, no wheezing, rales, rhonchi. No use of accessory muscles of respiration.  CARDIOVASCULAR: S1, S2 normal. No murmurs, rubs, or gallops.  ABDOMEN: Soft, nontender, nondistended. Bowel sounds present. No organomegaly or mass.  EXTREMITIES: No cyanosis, clubbing or edema b/l.    NEUROLOGIC: Cranial nerves II through XII are intact. No focal Motor or sensory deficits b/l.   PSYCHIATRIC:  patient is alert and oriented x 3.  SKIN: No obvious rash, lesion, or ulcer.   LABORATORY PANEL:  CBC Recent Labs  Lab 10/29/17 0437  WBC 7.2  HGB 8.4*  HCT 25.3*  PLT 208    Chemistries  Recent Labs  Lab 10/27/17 1655 10/29/17 0437  NA 140 137  K 4.4 3.5  CL 106 106  CO2 22 25  GLUCOSE 115* 87  BUN 28* 20  CREATININE 1.19 1.06  CALCIUM 9.3 8.1*  AST 18  --   ALT 9  --   ALKPHOS 68  --   BILITOT 0.6  --    Cardiac Enzymes No results for input(s): TROPONINI in the last 168 hours. RADIOLOGY:  No results found. ASSESSMENT AND PLAN:   Kyle Barron a46 y.o.malewith a known history of gastritis, Alzheimer's disease, hypertension, chronic a fib on only aspirin comes to the emergency room accompanied by daughter who mentioned patient has been feeling tired, fatigue, short of breath and looking pale lately. he was found to have hemoglobin of 4.9 at primary care's office. Hemoglobin was 5.6 here in the ER  * melena  H&H 8.4 up from 6.6-- 8.4 -status post three unit blood transfusion -EGD done October 28, 2017 noted for Barrett's esophagus/erythema  in the antrum, for colonoscopy on tomorrow per gastroenterology, change Protonix to p.o., CBC daily and transfuse as needed  -KOH prep negative for yeast  *history of chronic a fib on chronic baby aspirin Continue to hold aspirin  *Acute hematuria Etiology unknown Resolved Urology input appreciated, to follow-up with Dr. Eliberto Ivory for outpatient  work-up  *Chronic dementia Stable Continue increased nursing care PRN, aspiration/fall/skin care precautions while in house  Disposition pending clinical course in 1 to 2 days barring any complications  Case discussed with Care Management/Social Worker. Management plans discussed with the patient, family and they are in agreement.  CODE STATUS: DNR  DVT Prophylaxis: SCD  TOTAL TIME TAKING CARE OF THIS PATIENT: *25* minutes.  >50% time spent on counselling and coordination of care  POSSIBLE D/C IN *1-2* DAYS, DEPENDING ON CLINICAL CONDITION.  Note: This dictation was prepared with Dragon dictation along with smaller phrase technology. Any transcriptional errors that result from this process are unintentional.  Kyle Barron M.D on 10/30/2017 at 9:01 AM  Between 7am to 6pm - Pager - [phone]  After 6pm go to [email or web address] - password [email or web address] [facility]  Office  [phone]  CC: Primary care physician; Kyle Barron, MDPatient ID: Kyle Barron, male   DOB: [birth year], 84 y.o.   MRN: [id number]

## 2017-10-30 NOTE — Anesthesia Postprocedure Evaluation (Signed)
Anesthesia Post Note  Patient: Kyle Barron  Procedure(s) Performed: ESOPHAGOGASTRODUODENOSCOPY (EGD) (N/A ) ENTEROSCOPY  Patient location during evaluation: Endoscopy Anesthesia Type: General Level of consciousness: awake and alert Pain management: pain level controlled Vital Signs Assessment: post-procedure vital signs reviewed and stable Respiratory status: spontaneous breathing, nonlabored ventilation and respiratory function stable Cardiovascular status: blood pressure returned to baseline and stable Postop Assessment: no apparent nausea or vomiting Anesthetic complications: no     Last Vitals:  Vitals:   10/29/17 1929 10/30/17 0353  BP: (!) 131/98 (!) 127/57  Pulse: 75 71  Resp: 18 18  Temp: 36.8 C 36.7 C  SpO2: 94% 97%    Last Pain:  Vitals:   10/30/17 0500  TempSrc:   PainSc: 0-No pain                 [name]

## 2017-10-30 NOTE — Progress Notes (Signed)
   Kyle Antigua, MD [address], [place], [place], [place], [address] [address], [place], [place], [place], [address] Phone: [phone]  Fax: [phone]   Subjective: Pt drank the rest of his prep yesterday.  However, has only had 2 bowel movements consisting of brown material.  No further melena.  Hemoglobin stable   Objective: Exam: Vital signs in last 24 hours: Vitals:   10/29/17 1345 10/29/17 1929 10/30/17 0353 10/30/17 1106  BP: 121/78 (!) 131/98 (!) 127/57 135/77  Pulse: 73 75 71 79  Resp: 20 18 18 20   Temp: (!) 97.4 F (36.3 C) 98.3 F (36.8 C) 98 F (36.7 C) (!) 97.2 F (36.2 C)  TempSrc: Oral Oral Oral   SpO2: 93% 94% 97% 97%  Weight:      Height:       Weight change:   Intake/Output Summary (Last 24 hours) at 10/30/2017 1243 Last data filed at 10/30/2017 0801 Gross per 24 hour  Intake 1702.69 ml  Output 925 ml  Net 777.69 ml    General: No acute distress, AAO x3 Abd: Soft, NT/ND, No HSM Skin: Warm, no rashes Neck: Supple, Trachea midline   Lab Results: Lab Results  Component Value Date   WBC 7.2 10/29/2017   HGB 8.4 (L) 10/29/2017   HCT 25.3 (L) 10/29/2017   MCV 77.6 (L) 10/29/2017   PLT 208 10/29/2017   Micro Results: Recent Results (from the past 240 hour(s))  KOH prep     Status: None   Collection Time: 10/28/17  1:33 PM  Result Value Ref Range Status   Specimen Description ESOPHAGUS  Final   Special Requests NONE  Final   KOH Prep   Final    NO YEAST OR FUNGAL ELEMENTS SEEN Performed at [facility], [address] [place], [place] [address]    Report Status 10/28/2017 FINAL  Final   Studies/Results: No results found. Medications:  Scheduled Meds: . [MAR Hold] pantoprazole  20 mg Oral Daily   Continuous Infusions: . sodium chloride 20 mL/hr at 10/30/17 0620   PRN Meds:.[MAR Hold] acetaminophen **OR** [MAR Hold] acetaminophen   Assessment: Active Problems:   GI bleed   Esophageal lesion  Barrett's esophagus without dysplasia   Stomach irritation   Melena    Plan: We will proceed with colonoscopy today to evaluate for any colonic lesions that could have led to the patient's melena If colonoscopy is negative, patient will need small bowel capsule and he is agreeable to getting this done as well Small bowel capsule is not available today, and will have to be done tomorrow if needed Continue n.p.o. until procedure Enema given today as well since patient did not have expected bowel movements after the prep However, at this time given that patient took 2 days to take the prep, and ruling out colonic lesions that lead to his anemia important, therefore will not delay the procedure and proceed with colonoscopy today  I have discussed alternative options, risks & benefits,  which include, but are not limited to, bleeding, infection, perforation,respiratory complication & drug reaction.  The patient agrees with this plan & written consent will be obtained.     LOS: 3 days   Kyle Antigua, MD 10/30/2017, 12:43 PM

## 2017-10-30 NOTE — Op Note (Signed)
[facility] Gastroenterology Patient Name: Kyle Barron Procedure Date: 10/30/2017 12:40 PM MRN: [id number] Account #: [id number] Date of Birth: September 08, 1920 Admit Type: Inpatient Age: 84 Room: [facility] ENDO ROOM 2 Gender: Male Note Status: Finalized Procedure:            Colonoscopy Indications:          Melena, Acute post hemorrhagic anemia Providers:            Danil Wedge B. Bonna Gains MD, MD Referring MD:         Dion Body (Referring MD) Medicines:            Monitored Anesthesia Care Complications:        No immediate complications. Procedure:            Pre-Anesthesia Assessment:                       - ASA Grade Assessment: III - A patient with severe                        systemic disease.                       - Prior to the procedure, a History and Physical was                        performed, and patient medications, allergies and                        sensitivities were reviewed. The patient's tolerance of                        previous anesthesia was reviewed.                       - The risks and benefits of the procedure and the                        sedation options and risks were discussed with the                        patient. All questions were answered and informed                        consent was obtained.                       - Patient identification and proposed procedure were                        verified prior to the procedure by the physician, the                        nurse, the anesthesiologist, the anesthetist and the                        technician. The procedure was verified in the procedure                        room.                       After  obtaining informed consent, the colonoscope was                        passed under direct vision. Throughout the procedure,                        the patient's blood pressure, pulse, and oxygen                        saturations were monitored continuously. The                         Colonoscope was introduced through the anus and                        advanced to the the cecum, identified by appendiceal                        orifice and ileocecal valve. The colonoscopy was                        performed with ease. The patient tolerated the                        procedure well. The quality of the bowel preparation                        was poor. Findings:      The perianal and digital rectal examinations were normal.      Multiple patchy angiodysplastic lesions without bleeding were found in       the ascending colon. The solid brown stool throughout the colon and no       bleeding seen from the AVMs is consistent with no active bleeding from       AVMs at this time. The treatment method for AVMs is APC, but it requires       a good prep and clean colon, because it entails the risk of perforation       during the APC with a poor prep. Therefore, these were not treated.      Solid stool was seen in the colon, especially in the cecum and ascending       colon and some of it was cleaned with water flushes, water jet and       suctioning. The above reported AVMs were seen after cleaning. Other AVMs       may be present underneath the stool that were not seen due to the poor       prep.      A 6 mm polyp was found in the transverse colon. The polyp was sessile.       The polyp was removed with a cold snare. Resection and retrieval were       complete.      The exam was otherwise without abnormality.      The retroflexed view of the distal rectum and anal verge was normal and       showed no anal or rectal abnormalities. Impression:           - Preparation of the colon was poor.                       -  Multiple non-bleeding colonic angiodysplastic                        lesions. Since he has multiple AVMs and others may be                        present in the colon and the small bowel, these would                        explain patient's melena and  anemia. They are not                        actively bleeding at this time and brown stool without                        melena or red blood in the colon at this time is                        consistent with resolution of bleeding.                       - One 6 mm polyp in the transverse colon, removed with                        a cold snare. Resected and retrieved.                       - The examination was otherwise normal.                       - The distal rectum and anal verge are normal on                        retroflexion view.                       - Due to the prep, this was not a satisfactory exam for                        colorectal cancer screening, that is evaluation for                        small or flat lesions or polyps. Patient should follow                        up in clinic after discharge to discuss need for future                        colonoscopy and colorectal cancer screening. Recommendation:       - Check Ferritin and Iron level today                       -If patient is iron deficient he should get IV iron                        transfusion prior to discharge                       - Continue Serial CBCs and  transfuse PRN                       - Await pathology results.                       - Advance diet as tolerated.                       - Continue present medications.                       - The findings and recommendations were discussed with                        the patient.                       - The findings and recommendations were discussed with                        the patient's family.                       - Return to primary care physician in 4 weeks.                       - Return to my office in 2 weeks.                       - No signs of active bleeding since presentation to the                        hospital. If this re-occurs patient will benefit from                        bleeding scan or CTA depending on rate of  bleeding. Procedure Code(s):    --- Professional ---                       [phone], Colonoscopy, flexible; with removal of tumor(s),                        polyp(s), or other lesion(s) by snare technique Diagnosis Code(s):    --- Professional ---                       K55.20, Angiodysplasia of colon without hemorrhage                       D12.3, Benign neoplasm of transverse colon (hepatic                        flexure or splenic flexure)                       K92.1, Melena (includes Hematochezia)                       D62, Acute posthemorrhagic anemia CPT copyright 2017 American Medical Association. All rights reserved. The codes documented in this report are preliminary and upon coder review may  be revised to meet current compliance requirements.  Vonda Antigua, MD Margretta Sidle B. Landra Howze MD, MD 10/30/2017  1:30:45 PM This report has been signed electronically. Number of Addenda: 0 Note Initiated On: 10/30/2017 12:40 PM Scope Withdrawal Time: 0 hours 9 minutes 30 seconds  Total Procedure Duration: 0 hours 16 minutes 51 seconds  Estimated Blood Loss: Estimated blood loss: none.      [facility]

## 2017-10-30 NOTE — Anesthesia Preprocedure Evaluation (Addendum)
Anesthesia Evaluation  Patient identified by MRN, date of birth, ID band Patient awake    Reviewed: Allergy & Precautions, H&P , NPO status , reviewed documented beta blocker date and time   Airway Mallampati: II   Neck ROM: full    Dental  (+) Poor Dentition, Missing   Pulmonary former smoker,    Pulmonary exam normal        Cardiovascular hypertension,  Rhythm:irregular     Neuro/Psych PSYCHIATRIC DISORDERS Dementia    GI/Hepatic   Endo/Other    Renal/GU      Musculoskeletal   Abdominal   Peds  Hematology   Anesthesia Other Findings Past Medical History: No date: Alzheimer disease ([place]) No date: Hypertension  Past Surgical History: 10/28/2017: ENTEROSCOPY     Comment:  Procedure: ENTEROSCOPY;  Surgeon: Virgel Manifold,               MD;  Location: [facility] ENDOSCOPY;  Service: Endoscopy;; 10/28/2017: ESOPHAGOGASTRODUODENOSCOPY; N/A     Comment:  Procedure: ESOPHAGOGASTRODUODENOSCOPY (EGD);  Surgeon:               Virgel Manifold, MD;  Location: [facility] ENDOSCOPY;                Service: Endoscopy;  Laterality: N/A;  BMI    Body Mass Index:  22.32 kg/m      Reproductive/Obstetrics                            Anesthesia Physical Anesthesia Plan  ASA: III  Anesthesia Plan: General   Post-op Pain Management:    Induction: Intravenous  PONV Risk Score and Plan: 2 and Treatment may vary due to age or medical condition and TIVA  Airway Management Planned: Nasal Cannula and Natural Airway  Additional Equipment:   Intra-op Plan:   Post-operative Plan:   Informed Consent: I have reviewed the patients History and Physical, chart, labs and discussed the procedure including the risks, benefits and alternatives for the proposed anesthesia with the patient or authorized representative who has indicated his/her understanding and acceptance.   Dental Advisory Given  Plan  Discussed with: CRNA  Anesthesia Plan Comments:        Anesthesia Quick Evaluation

## 2017-10-30 NOTE — Anesthesia Post-op Follow-up Note (Signed)
Anesthesia QCDR form completed.        

## 2017-10-31 ENCOUNTER — Telehealth: Payer: Self-pay | Admitting: Gastroenterology

## 2017-10-31 LAB — SURGICAL PATHOLOGY

## 2017-10-31 MED ORDER — FERROUS SULFATE 325 (65 FE) MG PO TABS: 325.0000 mg | ORAL_TABLET | Freq: Two times a day (BID) | ORAL | 3 refills | Status: AC

## 2017-10-31 MED ORDER — SODIUM CHLORIDE 0.9 % IV SOLN
200.0000 mg | Freq: Once | INTRAVENOUS | Status: AC
  Administered 2017-10-31: 200 mg via INTRAVENOUS
  Filled 2017-10-31: qty 10

## 2017-10-31 NOTE — Care Management (Signed)
Patient admitted from home with anemia.  Patient hgb now stable at 8.4.  Patient to discharge today.  Patient live at home alone.  Daughter lives locally for support and transportation.  PCP [place]. Patient ambulates with walker at baseline.  Nursing staff to ambulate patient prior to discharge.  Patient to continue outpatient PT after discharge.

## 2017-10-31 NOTE — Telephone Encounter (Signed)
I told the patient and his daughter prior to them being discharged today.  I discussed the colonoscopy results again which included nonbleeding AVMs.  We discussed the options of repeat colonoscopy given that the last colonoscopy had a poor prep to rule out any small lesions, such as AVMs or polyps underneath the stool, versus further colonoscopy only if symptoms of melena or GI bleed occur again.  Patient and family would not like any colonoscopies at this time unless symptoms recur.  He has received IV iron transfusion and is feeling well.  They state due to his age, they would like to avoid any elective procedures and are okay with the underlying risk of small lesions underneath the stool, such as polyps that can turn into cancer, that were not able to be examined.  They are agreeable with follow-up as an outpatient.  Follow-up in GI clinic at which time hematology referral can be made for IV iron transfusions.  Alarm symptoms discussed in detail and patient was asked to contact us or go to the ER immediately if they notice melena, hematochezia, hematemesis, vomiting, weight loss or any other reason for concern.

## 2017-10-31 NOTE — Discharge Summary (Signed)
[place] [facility] [place] NAME: Kyle Barron    MR#:  [id number]  DATE OF BIRTH:  April 02, 1920  DATE OF ADMISSION:  10/27/2017 ADMITTING PHYSICIAN: Kyle Mandes, MD  DATE OF DISCHARGE: 10/31/2017  PRIMARY CARE PHYSICIAN: Kyle Body, MD    ADMISSION DIAGNOSIS:  Symptomatic anemia [D64.9] Gastrointestinal hemorrhage, unspecified gastrointestinal hemorrhage type [K92.2]  DISCHARGE DIAGNOSIS:  melena suspected due to slow G.I. Bleed-- colonoscopy showed AVMs-- not bleeding iron deficiency anemia status post three unit blood transfusion chronic a fib-- currently off aspirin acute hematuria resolved SECONDARY DIAGNOSIS:   Past Medical History:  Diagnosis Date  . Alzheimer disease ([place])   . Hypertension     HOSPITAL COURSE:   Kyle Barron a84 y.o.malewith a known history of gastritis, Alzheimer's disease, hypertension, chronic a fib on only aspirin comes to the emergency room accompanied by daughter who mentioned patient has been feeling tired, fatigue, short of breath and looking pale lately. he was found to have hemoglobin of 4.9 at primary care's office. Hemoglobin was 5.6 here in the ER  * melena suspected due to slow G.I. bleed with iron deficiency anemia H&H8.4up from 6.6-- three unit blood transfusion --- 8.4 -EGD done October 28, 2017 noted for Barrett's esophagus/erythema in the antrum, for colonoscopy on tomorrow per gastroenterology, change Protonix to p.o., CBC daily and transfuse as needed -KOH prep negative for yeast -colonoscopy - Preparation of the colon was poor.                       - Multiple non-bleeding colonic angiodysplastic                        lesions. Since he has multiple AVMs and others may be                        present in the colon and the small bowel, these would                        explain patient's melena and anemia. They are not                        actively bleeding  at this time and brown stool without                        melena or red blood in the colon at this time is                        consistent with resolution of bleeding.                       - One 6 mm polyp in the transverse colon, removed with                        a cold snare. Resected and retrieved. -Received a dose of IV iron and now started on oral iron treatment  *history of chronic a fib on chronic baby aspirin Continue to hold aspirin-- patient is seen by G.I. as outpatient  *Acute hematuria Etiology unknown Resolved Urology input appreciated, to follow-up with Kyle Barron for outpatient work-up  *Chronic dementia Stable Continue increased nursing care PRN, aspiration/fall/skin care precautions while in house  -pt is ambulatory  at home using walker. He gets outpatient physical therapy two times a week per daughter Kyle Barron. Will be discharged home this afternoon with resumption of outpatient physical therapy.  CONSULTS OBTAINED:  Treatment Team:  Kyle Sons, MD  DRUG ALLERGIES:   Allergies  Allergen Reactions  . Codeine Nausea And Vomiting  . Meperidine Other (See Comments) and Nausea And Vomiting    Caused vomiting & altered mental status  . Warfarin Other (See Comments)    Caused gastrointestinal bleeding- anemia    DISCHARGE MEDICATIONS:   Allergies as of 10/31/2017      Reactions   Codeine Nausea And Vomiting   Meperidine Other (See Comments), Nausea And Vomiting   Caused vomiting & altered mental status   Warfarin Other (See Comments)   Caused gastrointestinal bleeding- anemia      Medication List    STOP taking these medications   aspirin EC 81 MG tablet     TAKE these medications   divalproex 250 MG DR tablet Commonly known as:  DEPAKOTE Take 250 mg by mouth at bedtime.   ferrous sulfate 325 (65 FE) MG tablet Take 1 tablet (325 mg total) by mouth 2 (two) times daily with a meal.   levothyroxine 100 MCG tablet Commonly known as:   SYNTHROID, LEVOTHROID Take 100 mcg by mouth daily.   memantine 10 MG tablet Commonly known as:  NAMENDA Take 10 mg by mouth 2 (two) times daily.   metoprolol succinate 25 MG 24 hr tablet Commonly known as:  TOPROL-XL Take 25 mg by mouth daily.   mirtazapine 15 MG tablet Commonly known as:  REMERON Take 15 mg by mouth at bedtime.   vitamin B-12 1000 MCG tablet Commonly known as:  CYANOCOBALAMIN Take 1,000 mcg by mouth daily.   Vitamin D3 2000 units capsule Take 2,000 Units by mouth daily.       If you experience worsening of your admission symptoms, develop shortness of breath, life threatening emergency, suicidal or homicidal thoughts you must seek medical attention immediately by calling 911 or calling your MD immediately  if symptoms less severe.  You Must read complete instructions/literature along with all the possible adverse reactions/side effects for all the Medicines you take and that have been prescribed to you. Take any new Medicines after you have completely understood and accept all the possible adverse reactions/side effects.   Please note  You were cared for by a hospitalist during your hospital stay. If you have any questions about your discharge medications or the care you received while you were in the hospital after you are discharged, you can call the unit and asked to speak with the hospitalist on call if the hospitalist that took care of you is not available. Once you are discharged, your primary care physician will handle any further medical issues. Please note that NO REFILLS for any discharge medications will be authorized once you are discharged, as it is imperative that you return to your primary care physician (or establish a relationship with a primary care physician if you do not have one) for your aftercare needs so that they can reassess your need for medications and monitor your lab values. Today   SUBJECTIVE   No new complaints. VITAL SIGNS:   Blood pressure 126/69, pulse 74, temperature (!) 97.5 F (36.4 C), temperature source Oral, resp. rate 18, height 5\' 11"  (1.803 m), weight 72.6 kg, SpO2 98 %.  I/O:    Intake/Output Summary (Last 24 hours) at 10/31/2017 0930 Last  data filed at 10/31/2017 4010 Gross per 24 hour  Intake 780 ml  Output 300 ml  Net 480 ml    PHYSICAL EXAMINATION:  GENERAL:  84 y.o.-year-old patient lying in the bed with no acute distress.  EYES: Pupils equal, round, reactive to light and accommodation. No scleral icterus. Extraocular muscles intact.  HEENT: Head atraumatic, normocephalic. Oropharynx and nasopharynx clear.  NECK:  Supple, no jugular venous distention. No thyroid enlargement, no tenderness.  LUNGS: Normal breath sounds bilaterally, no wheezing, rales,rhonchi or crepitation. No use of accessory muscles of respiration.  CARDIOVASCULAR: S1, S2 normal. No murmurs, rubs, or gallops.  ABDOMEN: Soft, non-tender, non-distended. Bowel sounds present. No organomegaly or mass.  EXTREMITIES: No pedal edema, cyanosis, or clubbing.  NEUROLOGIC: Cranial nerves II through XII are intact. Muscle strength 5/5 in all extremities. Sensation intact. Gait not checked.  PSYCHIATRIC: The patient is alert and oriented x 3.  SKIN: No obvious rash, lesion, or ulcer.   DATA REVIEW:   CBC  Recent Labs  Lab 10/29/17 0437  WBC 7.2  HGB 8.4*  HCT 25.3*  PLT 208    Chemistries  Recent Labs  Lab 10/27/17 1655 10/29/17 0437  NA 140 137  K 4.4 3.5  CL 106 106  CO2 22 25  GLUCOSE 115* 87  BUN 28* 20  CREATININE 1.19 1.06  CALCIUM 9.3 8.1*  AST 18  --   ALT 9  --   ALKPHOS 68  --   BILITOT 0.6  --     Microbiology Results   Recent Results (from the past 240 hour(s))  KOH prep     Status: None   Collection Time: 10/28/17  1:33 PM  Result Value Ref Range Status   Specimen Description ESOPHAGUS  Final   Special Requests NONE  Final   KOH Prep   Final    NO YEAST OR FUNGAL ELEMENTS SEEN Performed  at [facility], [address], [place], [place] [address]    Report Status 10/28/2017 FINAL  Final    RADIOLOGY:  Dg Abd 1 View  Result Date: 10/30/2017 CLINICAL DATA:  Endoscopy today.  Constipation following bowel prep. EXAM: ABDOMEN - 1 VIEW COMPARISON:  07/18/2015 FINDINGS: No evidence of ileus or obstruction. No visible fecal matter by radiography. Extensive vascular calcification is noted. Curvature in degenerative changes of the spine. Surgical clips in the right groin related to previous hernia repair. IMPRESSION: Gas pattern unremarkable. No gross residual stool visible by radiography. Electronically Signed   By: Nelson Chimes M.D.   On: 10/30/2017 16:06     Management plans discussed with the patient, family and they are in agreement.  CODE STATUS:     Code Status Orders  (From admission, onward)         Start     Ordered   10/27/17 2036  Do not attempt resuscitation (DNR)  Continuous    Question Answer Comment  In the event of cardiac or respiratory ARREST Do not call a "code blue"   In the event of cardiac or respiratory ARREST Do not perform Intubation, CPR, defibrillation or ACLS   In the event of cardiac or respiratory ARREST Use medication by any route, position, wound care, and other measures to relive pain and suffering. May use oxygen, suction and manual treatment of airway obstruction as needed for comfort.      10/27/17 2035        Code Status History    This patient has a current  code status but no historical code status.    Advance Directive Documentation     Most Recent Value  Type of Advance Directive  Healthcare Power of Attorney  Pre-existing out of facility DNR order (yellow form or pink MOST form)  -  "MOST" Form in Place?  -      TOTAL TIME TAKING CARE OF THIS PATIENT: 40 minutes.    Kyle Barron M.D on 10/31/2017 at 9:30 AM  Between 7am to 6pm - Pager - [phone] After 6pm go to [email or web address] - password [email or web address] [facility]  Office  [phone]  CC: Primary care physician; Kyle Body, MD

## 2017-10-31 NOTE — Progress Notes (Signed)
Discharge teaching given to patient and his son, patient verbalized understanding and had no questions. Patient IV removed. Patient will be transported home by family. All patient belongings gathered prior to leaving.

## 2017-11-01 ENCOUNTER — Encounter: Payer: Self-pay | Admitting: Gastroenterology

## 2017-11-03 ENCOUNTER — Ambulatory Visit: Payer: Medicare Other | Attending: Family Medicine | Admitting: Physical Therapy

## 2017-11-03 DIAGNOSIS — R2689 Other abnormalities of gait and mobility: Secondary | ICD-10-CM | POA: Insufficient documentation

## 2017-11-05 ENCOUNTER — Encounter: Payer: Self-pay | Admitting: Physical Therapy

## 2017-11-05 ENCOUNTER — Encounter: Payer: Medicare Other | Admitting: Physical Therapy

## 2017-11-05 ENCOUNTER — Ambulatory Visit: Payer: Medicare Other | Admitting: Physical Therapy

## 2017-11-05 DIAGNOSIS — R2689 Other abnormalities of gait and mobility: Secondary | ICD-10-CM | POA: Diagnosis present

## 2017-11-05 NOTE — Anesthesia Postprocedure Evaluation (Signed)
Anesthesia Post Note  Patient: Kyle Barron  Procedure(s) Performed: COLONOSCOPY (N/A )  Patient location during evaluation: Endoscopy Anesthesia Type: General Level of consciousness: awake and alert Pain management: pain level controlled Vital Signs Assessment: post-procedure vital signs reviewed and stable Respiratory status: spontaneous breathing, nonlabored ventilation and respiratory function stable Cardiovascular status: blood pressure returned to baseline and stable Postop Assessment: no apparent nausea or vomiting Anesthetic complications: no     Last Vitals:  Vitals:   10/31/17 0528 10/31/17 1330  BP: 126/69 116/77  Pulse: 74 66  Resp: 18 16  Temp: (!) 36.4 C 36.7 C  SpO2: 98% 94%    Last Pain:  Vitals:   10/31/17 1330  TempSrc: Oral  PainSc:                  [name]

## 2017-11-05 NOTE — Therapy (Signed)
[facility] [address], [place], [address] Phone: [phone]   Fax:  [phone]  Physical Therapy Treatment  Patient Details  Name: Kyle Barron MRN: [id number] Date of Birth: [birth year] Referring Provider (PT): Netty Starring MD   Encounter Date: 11/05/2017  PT End of Session - 11/05/17 1426    Visit Number  12    Number of Visits  17    Date for PT Re-Evaluation  11/05/17    PT Start Time  0145    PT Stop Time  0230    PT Time Calculation (min)  45 min    Equipment Utilized During Treatment  Gait belt    Activity Tolerance  Patient tolerated treatment well    Behavior During Therapy  [facility] for tasks assessed/performed       Past Medical History:  Diagnosis Date  . Alzheimer disease ([place])   . Hypertension     Past Surgical History:  Procedure Laterality Date  . COLONOSCOPY N/A 10/30/2017   Procedure: COLONOSCOPY;  Surgeon: Virgel Manifold, MD;  Location: [facility] ENDOSCOPY;  Service: Endoscopy;  Laterality: N/A;  . ENTEROSCOPY  10/28/2017   Procedure: ENTEROSCOPY;  Surgeon: Virgel Manifold, MD;  Location: [place] ENDOSCOPY;  Service: Endoscopy;;  . ESOPHAGOGASTRODUODENOSCOPY N/A 10/28/2017   Procedure: ESOPHAGOGASTRODUODENOSCOPY (EGD);  Surgeon: Virgel Manifold, MD;  Location: [facility] ENDOSCOPY;  Service: Endoscopy;  Laterality: N/A;    There were no vitals filed for this visit.  Subjective Assessment - 11/05/17 1349    Subjective  Patient returns to PT following hospital admission for GI bleed causing low iron, and fatigue. Pt and daughter report that his bleed "healed" and and his breathing has improved. MD gave patient order to return to PT with medical clearance. Daughter reports since being home patient has been up and walking throughout the house with no difficulties.     Patient is accompained by:  Family member    Pertinent History  Patient is a 84 y/o male presenting with referral  for balance and gait deficits. Patient's daughter reports a "couple" falls in the past month, and her and patient report that falls/unsteadiness occurs when patient is turning when walking. Patient reports no pain. Patient lives alone, since his wife passed 3 months ago, which he is emotional about- but has a caretaker in the am, and his daughter assists him in the pm with heavy chores, and cooking.  Patient is completing basic ADLs on his own (bathing, dressing). Daughter reports patient has alzheimer's and is not safe at this time to cook on his own.      Limitations  Lifting;Standing;Walking;House hold activities    How long can you sit comfortably?  unlimited    How long can you stand comfortably?  5 mins    How long can you walk comfortably?  5 mins    Diagnostic tests  None at this time    Patient Stated Goals  Reduce falls, walk longer/more steady            Ther-Ex - Nustep L62mns; L3 620ms  - Mini squat with treadmill bar UE support for balance 3x 10 with min cuing for proper form with good carry over following - Square stepping 4x clockwise/counterclockwise 3x with min cuing for foot clearance with stepping - Dynamic balance tasks with patient negotiating stepping over obstacles 4in and 6in with good foot clearance with inc time noted to clear obstacle; weaving between cones with cuing to "  stay standing up tall" instead of crouched gait with smaller steps; with horizontal and vertical head turns with increased "drifting" L and R with ipsilateral head turns. Rest breaks betwee all sets of therex                       PT Education - 11/05/17 1356    Education Details  Exercise form    Person(s) Educated  Patient    Methods  Explanation;Demonstration;Verbal cues    Comprehension  Verbalized understanding;Returned demonstration;Verbal cues required       PT Short Term Goals - 09/10/17 2132      PT SHORT TERM GOAL #1   Title  Pt will be independent with HEP in  order to improve strength and balance in order to decrease fall risk and improve function at home and work.    Time  4    Period  Weeks    Status  New        PT Long Term Goals - 10/22/17 1542      PT LONG TERM GOAL #1   Title  Pt will improve DGI to at least 19/24 to demonstrate decreased risk for falls    Baseline  10/22/17 15/24    Time  8    Period  Weeks      PT LONG TERM GOAL #2   Title  Pt will improve BERG by at least 3 points in order to demonstrate clinically significant improvement in balance.    Baseline  10/22/17 46/56    Time  8    Period  Weeks    Status  Partially Met      PT LONG TERM GOAL #3   Title  Patient will demonstrate 4/5 hip ext bilat to normalize gait and improve hip strategy to reduce falls    Status  Deferred      PT LONG TERM GOAL #4   Title  Pt will decrease 5TSTS by at least 3 seconds in order to demonstrate clinically significant improvement in LE strength    Baseline  10/22/17: 21sec    Time  8    Period  Weeks    Status  New      PT LONG TERM GOAL #5   Title  Pt will increase 10MWT by at least 0.13 m/s in order to demonstrate clinically significant improvement in community ambulation.    Baseline  10/22/17: 1.73ms    Time  8    Period  Weeks    Status  New            Plan - 11/05/17 1455    Clinical Impression Statement  Patient is doing well following hospital admission and is able to participate fully in today's session. Patient is able to complete all therex with accuracy following PT cuing. Patient has slightly inc difficulty with dynamic gait tasks from previous sessions, but is able to complete safely with min cuing and CGA with gait belt for safety.     Rehab Potential  Good    Clinical Impairments Affecting Rehab Potential  (+) support system, motivation (-) age, dementia, other comorbidities, hx of falls    PT Frequency  2x / week    PT Duration  8 weeks    PT Treatment/Interventions  Passive range of motion;Neuromuscular  re-education;Gait training;Dry needling;Manual techniques;Patient/family education;Balance training;Therapeutic exercise;Therapeutic activities;Moist Heat;Functional mobility training;Aquatic Therapy    PT Next Visit Plan  dynamic balance and LE strengthening REASSESS  PT Home Exercise Plan  bridge, standing heel raises, thomas stretch    Consulted and Agree with Plan of Care  Patient    Family Member Consulted  Daughter       Patient will benefit from skilled therapeutic intervention in order to improve the following deficits and impairments:  Abnormal gait, Increased fascial restricitons, Impaired sensation, Pain, Improper body mechanics, Postural dysfunction, Impaired tone, Increased muscle spasms, Decreased mobility, Decreased range of motion, Decreased endurance, Decreased activity tolerance, Decreased strength, Decreased balance, Difficulty walking, Impaired flexibility  Visit Diagnosis: Other abnormalities of gait and mobility     Problem List Patient Active Problem List   Diagnosis Date Noted  . Esophageal lesion   . Barrett's esophagus without dysplasia   . Stomach irritation   . Melena   . GI bleed 10/27/2017   Shelton Silvas PT, DPT  Shelton Silvas 11/05/2017, 3:00 PM  [facility] [address], [place], [address] Phone: [phone]   Fax:  [phone]  Name: Kyle Barron MRN: [id number] Date of Birth: July 14, 1920

## 2017-11-10 ENCOUNTER — Telehealth: Payer: Self-pay

## 2017-11-10 NOTE — Telephone Encounter (Signed)
Phone rang, no voice mail, no answer.

## 2017-11-10 NOTE — Telephone Encounter (Signed)
-----   Message from Virgel Manifold, MD sent at 11/07/2017  3:47 PM EDT ----- Jackelyn Poling please let patient know, the polyp removed from his colon was benign but precancerous. This was completely removed. Follow up in clinic as scheduled.

## 2017-11-11 ENCOUNTER — Encounter: Payer: Self-pay | Admitting: Physical Therapy

## 2017-11-11 ENCOUNTER — Ambulatory Visit: Payer: Medicare Other | Admitting: Physical Therapy

## 2017-11-11 DIAGNOSIS — R2689 Other abnormalities of gait and mobility: Secondary | ICD-10-CM | POA: Diagnosis not present

## 2017-11-11 NOTE — Addendum Note (Signed)
Addended by: Shelton Silvas on: 11/11/2017 03:34 PM   Modules accepted: Orders

## 2017-11-11 NOTE — Therapy (Signed)
[facility] [facility] [address], [place], [address] Phone: [phone]   Fax:  [phone]  Physical Therapy Treatment  Patient Details  Name: Kyle Barron MRN: [id number] Date of Birth: 07-02-1920 Referring Provider (PT): Netty Starring MD   Encounter Date: 11/11/2017  PT End of Session - 11/11/17 1437    Visit Number  13    Number of Visits  33    Date for PT Re-Evaluation  01/06/18    PT Start Time  0230    PT Stop Time  0315    PT Time Calculation (min)  45 min    Equipment Utilized During Treatment  Gait belt    Activity Tolerance  Patient tolerated treatment well    Behavior During Therapy  [facility] for tasks assessed/performed       Past Medical History:  Diagnosis Date  . Alzheimer disease ([place])   . Hypertension     Past Surgical History:  Procedure Laterality Date  . COLONOSCOPY N/A 10/30/2017   Procedure: COLONOSCOPY;  Surgeon: Virgel Manifold, MD;  Location: [facility] ENDOSCOPY;  Service: Endoscopy;  Laterality: N/A;  . ENTEROSCOPY  10/28/2017   Procedure: ENTEROSCOPY;  Surgeon: Virgel Manifold, MD;  Location: [place] ENDOSCOPY;  Service: Endoscopy;;  . ESOPHAGOGASTRODUODENOSCOPY N/A 10/28/2017   Procedure: ESOPHAGOGASTRODUODENOSCOPY (EGD);  Surgeon: Virgel Manifold, MD;  Location: [facility] ENDOSCOPY;  Service: Endoscopy;  Laterality: N/A;    There were no vitals filed for this visit.  Subjective Assessment - 11/11/17 1434    Subjective  Patient reports that he has been feeling much better this past week. Patient reports he does feel as though his balance has improved with reduced falls and decreased "unsteadiness" since beginning PT. Patient reports compliance with HEP with no questions or concerns.     Patient is accompained by:  Family member    Pertinent History  Patient is a 84 y/o male presenting with referral for balance and gait deficits. Patient's daughter reports a "couple" falls in the  past month, and her and patient report that falls/unsteadiness occurs when patient is turning when walking. Patient reports no pain. Patient lives alone, since his wife passed 3 months ago, which he is emotional about- but has a caretaker in the am, and his daughter assists him in the pm with heavy chores, and cooking.  Patient is completing basic ADLs on his own (bathing, dressing). Daughter reports patient has alzheimer's and is not safe at this time to cook on his own.      Limitations  Lifting;Standing;Walking;House hold activities    How long can you sit comfortably?  unlimited    How long can you stand comfortably?  5 mins    How long can you walk comfortably?  5 mins    Diagnostic tests  None at this time    Patient Stated Goals  Reduce falls, walk longer/more steady        Ther-Ex - Nustep L57min - PT led patient through 6MWT where patient ambulates with decreased DF causing shuffle steps and forward head which he is able to correct 25% wit PT cuing over ft with no inc pain, only muscle fatigue noted following. 02 sats 91% following - 2 trials of 5xSTS test with best time sec - Led patient through BERG balance assessment, where patient demonstrates good improvement in static balance, with remaining deficits in SLS - Led patient through DGI where patient is demonstrating improvement with dynamic gait balance, with SBA  for safety. Patient is able to maintain balance with all demands of test, with mild deviations (veering to L and R with walking, decreased velocity wit tasks) - 10MWT x3 trials with best time 7.8sec - Review of current HEP and gait practice with focus on keeping head level with horizon, and for bilat foot clearance                           PT Education - 11/11/17 1436    Education Details  Goal update, POC update    Person(s) Educated  Patient    Methods  Explanation;Demonstration;Verbal cues    Comprehension  Verbalized understanding;Returned  demonstration;Verbal cues required       PT Short Term Goals - 11/11/17 1438      PT SHORT TERM GOAL #1   Title  Pt will be independent with HEP in order to improve strength and balance in order to decrease fall risk and improve function at home and work.    Time  4    Period  Weeks    Status  On-going        PT Long Term Goals - 11/11/17 1438      PT LONG TERM GOAL #1   Title  Pt will improve DGI to at least 19/24 to demonstrate decreased risk for falls    Baseline  11/11/17 17/24    Time  8    Period  Weeks    Status  On-going      PT LONG TERM GOAL #2   Title  Pt will improve BERG by at least 3 points in order to demonstrate clinically significant improvement in balance.    Baseline  11/11/17 52/56    Time  8    Period  Weeks    Status  Achieved      PT LONG TERM GOAL #3   Title  Patient will demonstrate 4/5 hip ext bilat to normalize gait and improve hip strategy to reduce falls    Time  8    Period  Weeks    Status  Deferred      PT LONG TERM GOAL #4   Title  Pt will decrease 5TSTS by at least 3 seconds in order to demonstrate clinically significant improvement in LE strength    Baseline  11/11/17 19sec    Time  8    Period  Weeks    Status  On-going      PT LONG TERM GOAL #5   Title  Pt will increase 10MWT by at least 0.13 m/s in order to demonstrate clinically significant improvement in community ambulation.    Baseline  11/11/17 1.42m/s    Time  8    Period  Weeks    Status  On-going      Additional Long Term Goals   Additional Long Term Goals  Yes      PT LONG TERM GOAL #6   Title  Pt will increase 6MWT by at least 72m (17ft) in order to demonstrate clinically significant improvement in cardiopulmonary and muscular endurance for community ambulation    Baseline  11/11/17 819ft    Time  8    Period  Weeks    Status  New            Plan - 11/11/17 1522    Clinical Impression Statement  PT reassessed goals this session, where patient is  demonstrating good progress despite set back of  recent hospitalization. Patient is demonstrating better static balance, with some remaining deficits in activity endurance, dynamic gait activity, and LE strength. PT will continue to work to improve this deficits to reduce fall risk and improve QOL.     Rehab Potential  Good    Clinical Impairments Affecting Rehab Potential  (+) support system, motivation (-) age, dementia, other comorbidities, hx of falls    PT Frequency  2x / week    PT Duration  8 weeks    PT Treatment/Interventions  Passive range of motion;Neuromuscular re-education;Gait training;Dry needling;Manual techniques;Patient/family education;Balance training;Therapeutic exercise;Therapeutic activities;Moist Heat;Functional mobility training;Aquatic Therapy    PT Next Visit Plan  dynamic balance and LE strengthening     PT Home Exercise Plan  bridge, standing heel raises, thomas stretch    Consulted and Agree with Plan of Care  Patient    Family Member Consulted  Daughter       Patient will benefit from skilled therapeutic intervention in order to improve the following deficits and impairments:  Abnormal gait, Increased fascial restricitons, Impaired sensation, Pain, Improper body mechanics, Postural dysfunction, Impaired tone, Increased muscle spasms, Decreased mobility, Decreased range of motion, Decreased endurance, Decreased activity tolerance, Decreased strength, Decreased balance, Difficulty walking, Impaired flexibility  Visit Diagnosis: Other abnormalities of gait and mobility  Balance disorder     Problem List Patient Active Problem List   Diagnosis Date Noted  . Esophageal lesion   . Barrett's esophagus without dysplasia   . Stomach irritation   . Melena   . GI bleed 10/27/2017   Shelton Silvas PT, DPT Shelton Silvas 11/11/2017, 3:28 PM  [facility] [facility] [address] [place],  [address] Phone: [phone]   Fax:  [phone]  Name: Kyle Barron MRN: [id number] Date of Birth: 06/14/1920

## 2017-11-13 ENCOUNTER — Ambulatory Visit: Payer: Medicare Other | Admitting: Physical Therapy

## 2017-11-16 ENCOUNTER — Other Ambulatory Visit: Payer: Self-pay

## 2017-11-16 ENCOUNTER — Emergency Department: Payer: Medicare Other

## 2017-11-16 ENCOUNTER — Emergency Department
Admission: EM | Admit: 2017-11-16 | Discharge: 2017-11-17 | Disposition: A | Discharge status: Home / Self Care | Payer: Medicare Other | Attending: Emergency Medicine | Admitting: Emergency Medicine

## 2017-11-16 DIAGNOSIS — R918 Other nonspecific abnormal finding of lung field: Secondary | ICD-10-CM

## 2017-11-16 DIAGNOSIS — Z87891 Personal history of nicotine dependence: Secondary | ICD-10-CM | POA: Diagnosis not present

## 2017-11-16 DIAGNOSIS — I10 Essential (primary) hypertension: Secondary | ICD-10-CM | POA: Diagnosis not present

## 2017-11-16 DIAGNOSIS — G309 Alzheimer's disease, unspecified: Secondary | ICD-10-CM | POA: Insufficient documentation

## 2017-11-16 DIAGNOSIS — I2694 Multiple subsegmental pulmonary emboli without acute cor pulmonale: Secondary | ICD-10-CM

## 2017-11-16 DIAGNOSIS — Z79899 Other long term (current) drug therapy: Secondary | ICD-10-CM | POA: Diagnosis not present

## 2017-11-16 DIAGNOSIS — R079 Chest pain, unspecified: Secondary | ICD-10-CM | POA: Diagnosis present

## 2017-11-16 DIAGNOSIS — J90 Pleural effusion, not elsewhere classified: Secondary | ICD-10-CM

## 2017-11-16 LAB — TROPONIN I: Troponin I: <0.03 ng/mL (ref <0.03)

## 2017-11-16 LAB — BASIC METABOLIC PANEL
ANION GAP: 7 (ref 5–15)
BUN: 16 mg/dL (ref 8–23)
CO2: 25 mmol/L (ref 22–32)
Calcium: 8.2 mg/dL — ABNORMAL LOW (ref 8.9–10.3)
Chloride: 105 mmol/L (ref 98–111)
Creatinine, Ser: 1.06 mg/dL (ref 0.61–1.24)
GFR calc Af Amer: >60 mL/min (ref >60)
GFR, EST NON AFRICAN AMERICAN: 58 mL/min — AB (ref >60)
GLUCOSE: 124 mg/dL — AB (ref 70–99)
POTASSIUM: 3.7 mmol/L (ref 3.5–5.1)
Sodium: 137 mmol/L (ref 135–145)

## 2017-11-16 MED ORDER — IOPAMIDOL (ISOVUE-370) INJECTION 76%
75.0000 mL | Freq: Once | INTRAVENOUS | Status: AC | PRN
  Administered 2017-11-16: 75 mL via INTRAVENOUS

## 2017-11-16 MED ORDER — SODIUM CHLORIDE 0.9 % IV BOLUS
500.0000 mL | Freq: Once | INTRAVENOUS | Status: AC
  Administered 2017-11-16: 500 mL via INTRAVENOUS

## 2017-11-16 NOTE — ED Triage Notes (Signed)
Pt arrived from home with CP, pt states pressure in the chest. Pt has hx of afib and GERD and had recently stopped taking his Zantac. EMS vitals 166/93. EMS reports that pt will not take aspirin due to ulcers in stomach.

## 2017-11-16 NOTE — ED Provider Notes (Signed)
[facility] Emergency Department Provider Note  ___________________________________________   First MD Initiated Contact with Patient 11/16/17 2322     (approximate)  I have reviewed the triage vital signs and the nursing notes.   HISTORY  Chief Complaint Chest Pain   HPI Kyle Barron is a 84 y.o. male with a history of atrial fibrillation recently off anticoagulation secondary to GI bleeding who was presented to the emergency department today with left-sided chest pain.  He is unable to give a very detailed history secondary to his dementia but says that is been ongoing for approximately 12 hours and over his left nipple and worsening with deep breathing.  He says that he also has minor associated shortness of breath.  Family at bedside and corroborates the story as well.   Past Medical History:  Diagnosis Date  . Alzheimer disease ([place])   . Hypertension     Patient Active Problem List   Diagnosis Date Noted  . Esophageal lesion   . Barrett's esophagus without dysplasia   . Stomach irritation   . Melena   . GI bleed 10/27/2017    Past Surgical History:  Procedure Laterality Date  . COLONOSCOPY N/A 10/30/2017   Procedure: COLONOSCOPY;  Surgeon: Virgel Manifold, MD;  Location: [facility] ENDOSCOPY;  Service: Endoscopy;  Laterality: N/A;  . ENTEROSCOPY  10/28/2017   Procedure: ENTEROSCOPY;  Surgeon: Virgel Manifold, MD;  Location: [facility] ENDOSCOPY;  Service: Endoscopy;;  . ESOPHAGOGASTRODUODENOSCOPY N/A 10/28/2017   Procedure: ESOPHAGOGASTRODUODENOSCOPY (EGD);  Surgeon: Virgel Manifold, MD;  Location: [facility] ENDOSCOPY;  Service: Endoscopy;  Laterality: N/A;    Prior to Admission medications   Medication Sig Start Date End Date Taking? Authorizing Provider  Cholecalciferol (VITAMIN D3) 2000 units capsule Take 2,000 Units by mouth daily.    [provider]  divalproex (DEPAKOTE) 250 MG DR tablet Take 250 mg by mouth at bedtime.  08/08/17   [provider]  ferrous sulfate 325 (65 FE) MG tablet Take 1 tablet (325 mg total) by mouth 2 (two) times daily with a meal. 10/31/17   Fritzi Mandes, MD  levothyroxine (SYNTHROID, LEVOTHROID) 100 MCG tablet Take 100 mcg by mouth daily. 09/19/17   [provider]  memantine (NAMENDA) 10 MG tablet Take 10 mg by mouth 2 (two) times daily. 10/18/17   [provider]  metoprolol succinate (TOPROL-XL) 25 MG 24 hr tablet Take 25 mg by mouth daily. 10/21/17   [provider]  mirtazapine (REMERON) 15 MG tablet Take 15 mg by mouth at bedtime. 08/18/17   [provider]  vitamin B-12 (CYANOCOBALAMIN) 1000 MCG tablet Take 1,000 mcg by mouth daily.    [provider]    Allergies Codeine; Meperidine; and Warfarin  History reviewed. No pertinent family history.  Social History Social History   Tobacco Use  . Smoking status: Former Research scientist (life sciences)  . Smokeless tobacco: Never Used  Substance Use Topics  . Alcohol use: Not on file  . Drug use: Not on file    Review of Systems  Constitutional: No fever/chills Eyes: No visual changes. ENT: No sore throat. Cardiovascular: As above Respiratory: Denies shortness of breath. Gastrointestinal: No abdominal pain.  No nausea, no vomiting.  No diarrhea.  No constipation. Genitourinary: Negative for dysuria. Musculoskeletal: Negative for back pain. Skin: Negative for rash. Neurological: Negative for headaches, focal weakness or numbness.   ____________________________________________   PHYSICAL EXAM:  VITAL SIGNS: ED Triage Vitals  Enc Vitals Group  BP 11/16/17 2153 (!) 124/106     Pulse Rate 11/16/17 2153 82     Resp 11/16/17 2153 19     Temp 11/16/17 2153 98.9 F (37.2 C)     Temp src --      SpO2 11/16/17 2153 100 %     Weight 11/16/17 2154 160 lb (72.6 kg)     Height 11/16/17 2154 5\' 11"  (1.803 m)     Head Circumference --      Peak Flow --      Pain Score 11/16/17 2153 10      Pain Loc --      Pain Edu? --      Excl. in [place]? --     Constitutional: Alert and oriented. Well appearing and in no acute distress. Eyes: Conjunctivae are normal.  Head: Atraumatic. Nose: No congestion/rhinnorhea. Mouth/Throat: Mucous membranes are moist.  Neck: No stridor.   Cardiovascular: Normal rate, regular rhythm. Grossly normal heart sounds.  Chest pain not reproducible with palpation Respiratory: Normal respiratory effort.  No retractions. Lungs CTAB. Gastrointestinal: Soft and nontender. No distention.  Musculoskeletal: No lower extremity tenderness nor edema.  No joint effusions. Neurologic:  Normal speech and language. No gross focal neurologic deficits are appreciated. Skin:  Skin is warm, dry and intact. No rash noted. Psychiatric: Mood and affect are normal. Speech and behavior are normal.  ____________________________________________   LABS (all labs ordered are listed, but only abnormal results are displayed)  Labs Reviewed  CBC WITH DIFFERENTIAL/PLATELET - Abnormal; Notable for the following components:      Result Value   RBC 4.01 (*)    Hemoglobin 10.0 (*)    HCT 34.1 (*)    MCH 24.9 (*)    MCHC 29.3 (*)    RDW 23.5 (*)    All other components within normal limits  BASIC METABOLIC PANEL - Abnormal; Notable for the following components:   Glucose, Bld 124 (*)    Calcium 8.2 (*)    GFR calc non Af Amer 58 (*)    All other components within normal limits  TROPONIN I   ____________________________________________  EKG  ED ECG REPORT I, Doran Stabler, the attending physician, personally viewed and interpreted this ECG.   Date: 11/16/2017  EKG Time: 2157  Rate: 105  Rhythm: atrial fibrillation, rate 105 had multiple PVCs present.  Axis: Normal  Intervals:RBBB with LAFB  ST&T Change: No ST segment elevation or depression.  T wave inversions in V2 and V3.  No significant change from  previous.  ____________________________________________  RADIOLOGY  Chest x-ray with multiple rounded masslike opacities in the lungs bilaterally concerning for metastases.  Bilateral effusions with right greater than left. ____________________________________________   PROCEDURES  Procedure(s) performed:   Procedures  Critical Care performed:   ____________________________________________   INITIAL IMPRESSION / ASSESSMENT AND PLAN / ED COURSE  Pertinent labs & imaging results that were available during my care of the patient were reviewed by me and considered in my medical decision making (see chart for details).  Differential diagnosis includes, but is not limited to, ACS, aortic dissection, pulmonary embolism, cardiac tamponade, pneumothorax, pneumonia, pericarditis, myocarditis, GI-related causes including esophagitis/gastritis, and musculoskeletal chest wall pain.   As part of my medical decision making, I reviewed the following data within the electronic MEDICAL RECORD NUMBER Notes from prior ED visits  ----------------------------------------- 11:50 PM on 11/16/2017 -----------------------------------------  Patient at this time pending CT of the chest.  Family aware of chest x-ray and possible  new cancer diagnosis.  They understand that we are doing this largely for diagnostic purposes and that since the patient is 84 years old and has failed anticoagulation secondary to GI bleeding that it is unlikely that he would be able to tolerate aggressive treatment if the imaging is revealing of the diagnosis such as pulmonary emboli.  Furthermore, if he does not fact have cancer it is also unlikely that he would be treated aggressively because of his age and dementia.  Family is understanding of this.  Pending imaging at this time.  Signed out to Dr. Karma Greaser. ____________________________________________   FINAL CLINICAL IMPRESSION(S) / ED DIAGNOSES  Chest pain.  NEW MEDICATIONS  STARTED DURING THIS VISIT:  New Prescriptions   No medications on file     Note:  This document was prepared using Dragon voice recognition software and may include unintentional dictation errors.     Orbie Pyo, MD 11/16/17 2351

## 2017-11-16 NOTE — ED Provider Notes (Signed)
----------------------------------------- 11:21 PM on 11/16/2017 -----------------------------------------   Assuming care from Dr. Clearnce Hasten.  In short, Kyle Barron is a 84 y.o. male with a chief complaint of chest pain.  Refer to the original H&P for additional details.  The current plan of care is to follow up CTA chest and reassess.   ----------------------------------------- 1:10 AM on 11/17/2017 -----------------------------------------  CTA chest is notable for probable metastatic lesions, multiple small subsegmental pulmonary emboli, and bilateral pleural effusions with the one on the right being large.  However, in spite of this, the patient is in no acute distress and states he only has some aching when he takes a deep breath.  I discussed in detail with the patient and his daughter the results and the grave prognosis.  I explained that I could speak with the hospitalist about admission but I do not necessarily recommend this because he can follow-up as an outpatient given that he is in no distress.  They very much agree with this plan and the patient does not want to stay in the hospital if he does not have to.  I verified his history of GI bleeding and explained that I feel that the risk of starting him on blood thinners is greater than the risk of him having a catastrophic event with the pulmonary emboli, but I explained that he has multiple conditions that are life-threatening.  He and his daughter understand and will follow-up as an outpatient with his primary care provider and I am also providing the information for oncology follow-up.  I gave strict return precautions and he and his daughter understand and agree with the plan.   RADIOLOGY REPORT: Ct Angio Chest Pe W And/or Wo Contrast  Result Date: 11/17/2017 CLINICAL DATA:  Chest pain EXAM: CT ANGIOGRAPHY CHEST WITH CONTRAST TECHNIQUE: Multidetector CT imaging of the chest was performed using the standard protocol  during bolus administration of intravenous contrast. Multiplanar CT image reconstructions and MIPs were obtained to evaluate the vascular anatomy. CONTRAST:  39mL ISOVUE-370 IOPAMIDOL (ISOVUE-370) INJECTION 76% COMPARISON:  Chest x-ray earlier today FINDINGS: Cardiovascular: Filling defects are noted in right upper lobe pulmonary arterial branches compatible with pulmonary emboli. Small in blood noted in the left lower lobe pulmonary arterial branches as well. Small right lower lobe pulmonary embolus also present near the right lung base. Cardiomegaly. Moderate coronary artery and aortic atherosclerosis. Proximal descending thoracic aorta mildly aneurysmal at 4.2 cm. Mediastinum/Nodes: Bulky subcarinal adenopathy with a short axis diameter of 3.2 cm. No visible hilar or axillary adenopathy. Lungs/Pleura: Large right pleural effusion and small left pleural effusion. Numerous bilateral pulmonary nodules and masses noted. Index mass in the superior segment of the left lower lobe measures up to 4.4 cm. Index right middle lobe mass anteriorly measures up to 3 cm. Numerous other similarly sized and smaller bilateral pulmonary nodules. Upper Abdomen: Low-density lesion in the right hepatic lobe measures 2 cm, difficult to characterize on this study. This may reflect a cyst. No adrenal mass. Musculoskeletal: No acute bony abnormality or focal bone lesion. Review of the MIP images confirms the above findings. IMPRESSION: Small bilateral pulmonary emboli as described above. No evidence of right heart strain. Numerous bilateral pulmonary nodules and masses as described above most compatible with metastases. Associated bulky subcarinal adenopathy. Large right pleural effusion and small left effusion. 4.2 cm proximal descending thoracic aortic aneurysm. Moderate coronary artery disease, aortic atherosclerosis. These results were called by telephone at the time of interpretation on 11/17/2017 at 12:02 am to Dr.  [place] ,  who verbally acknowledged these results. Electronically Signed   By: Rolm Baptise M.D.   On: 11/17/2017 00:04      Hinda Kehr, MD 11/17/17 [phone]

## 2017-11-16 NOTE — ED Notes (Signed)
Patient transported to CT 

## 2017-11-16 NOTE — ED Notes (Signed)
Call from lab: light green hemolyzed and lavender supsect for same. Redraw, D, RN and EDP notified

## 2017-11-17 ENCOUNTER — Ambulatory Visit: Payer: Medicare Other | Admitting: Physical Therapy

## 2017-11-17 DIAGNOSIS — R079 Chest pain, unspecified: Secondary | ICD-10-CM | POA: Diagnosis not present

## 2017-11-17 LAB — CBC WITH DIFFERENTIAL/PLATELET
Abs Immature Granulocytes: 0.03 10*3/uL (ref 0.00–0.07)
BASOS PCT: 1 %
Basophils Absolute: 0.1 10*3/uL (ref 0.0–0.1)
Eosinophils Absolute: 1.2 10*3/uL — ABNORMAL HIGH (ref 0.0–0.5)
Eosinophils Relative: 16 %
HEMATOCRIT: 34.1 % — AB (ref 39.0–52.0)
HEMOGLOBIN: 10 g/dL — AB (ref 13.0–17.0)
IMMATURE GRANULOCYTES: 0 %
Lymphocytes Relative: 18 %
Lymphs Abs: 1.4 10*3/uL (ref 0.7–4.0)
MCH: 24.9 pg — ABNORMAL LOW (ref 26.0–34.0)
MCHC: 29.3 g/dL — ABNORMAL LOW (ref 30.0–36.0)
MCV: 85 fL (ref 80.0–100.0)
Monocytes Absolute: 0.8 10*3/uL (ref 0.1–1.0)
Monocytes Relative: 11 %
NEUTROS PCT: 54 %
Neutro Abs: 4 10*3/uL (ref 1.7–7.7)
Platelets: 363 10*3/uL (ref 150–400)
RBC: 4.01 MIL/uL — AB (ref 4.22–5.81)
RDW: 23.5 % — ABNORMAL HIGH (ref 11.5–15.5)
WBC: 7.6 10*3/uL (ref 4.0–10.5)
nRBC: 0 % (ref 0.0–0.2)

## 2017-11-17 NOTE — Discharge Instructions (Signed)
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  As we discussed you are not a good candidate for blood thinner based on your history of gastrointestinal bleeding.  We discussed the possibility of coming into the hospital but given that follow-up can be arranged relatively quickly as an outpatient we feel that this is appropriate and you agree with this plan.  However, if your symptoms get worse, such as worsening breathing or worsening chest pain, please call 911 or return to the nearest emergency department as soon as possible.

## 2017-11-18 ENCOUNTER — Ambulatory Visit: Payer: Medicare Other | Admitting: Gastroenterology

## 2017-11-19 ENCOUNTER — Ambulatory Visit: Payer: Medicare Other | Admitting: Physical Therapy

## 2017-11-25 ENCOUNTER — Ambulatory Visit: Payer: Medicare Other | Admitting: Physical Therapy

## 2017-11-28 ENCOUNTER — Ambulatory Visit: Payer: Medicare Other | Admitting: Physical Therapy

## 2017-12-23 ENCOUNTER — Ambulatory Visit: Payer: Medicare Other | Admitting: Gastroenterology

## 2018-01-28 DEATH — deceased

## 2020-07-25 IMAGING — DX DG CHEST 1V
2 series · 2 of 2 positions shown · non-contrast
Comparison: 10/30/2009

CLINICAL DATA: Chest pain

EXAM:
CHEST  1 VIEW

[chest ap (1 of 2)]
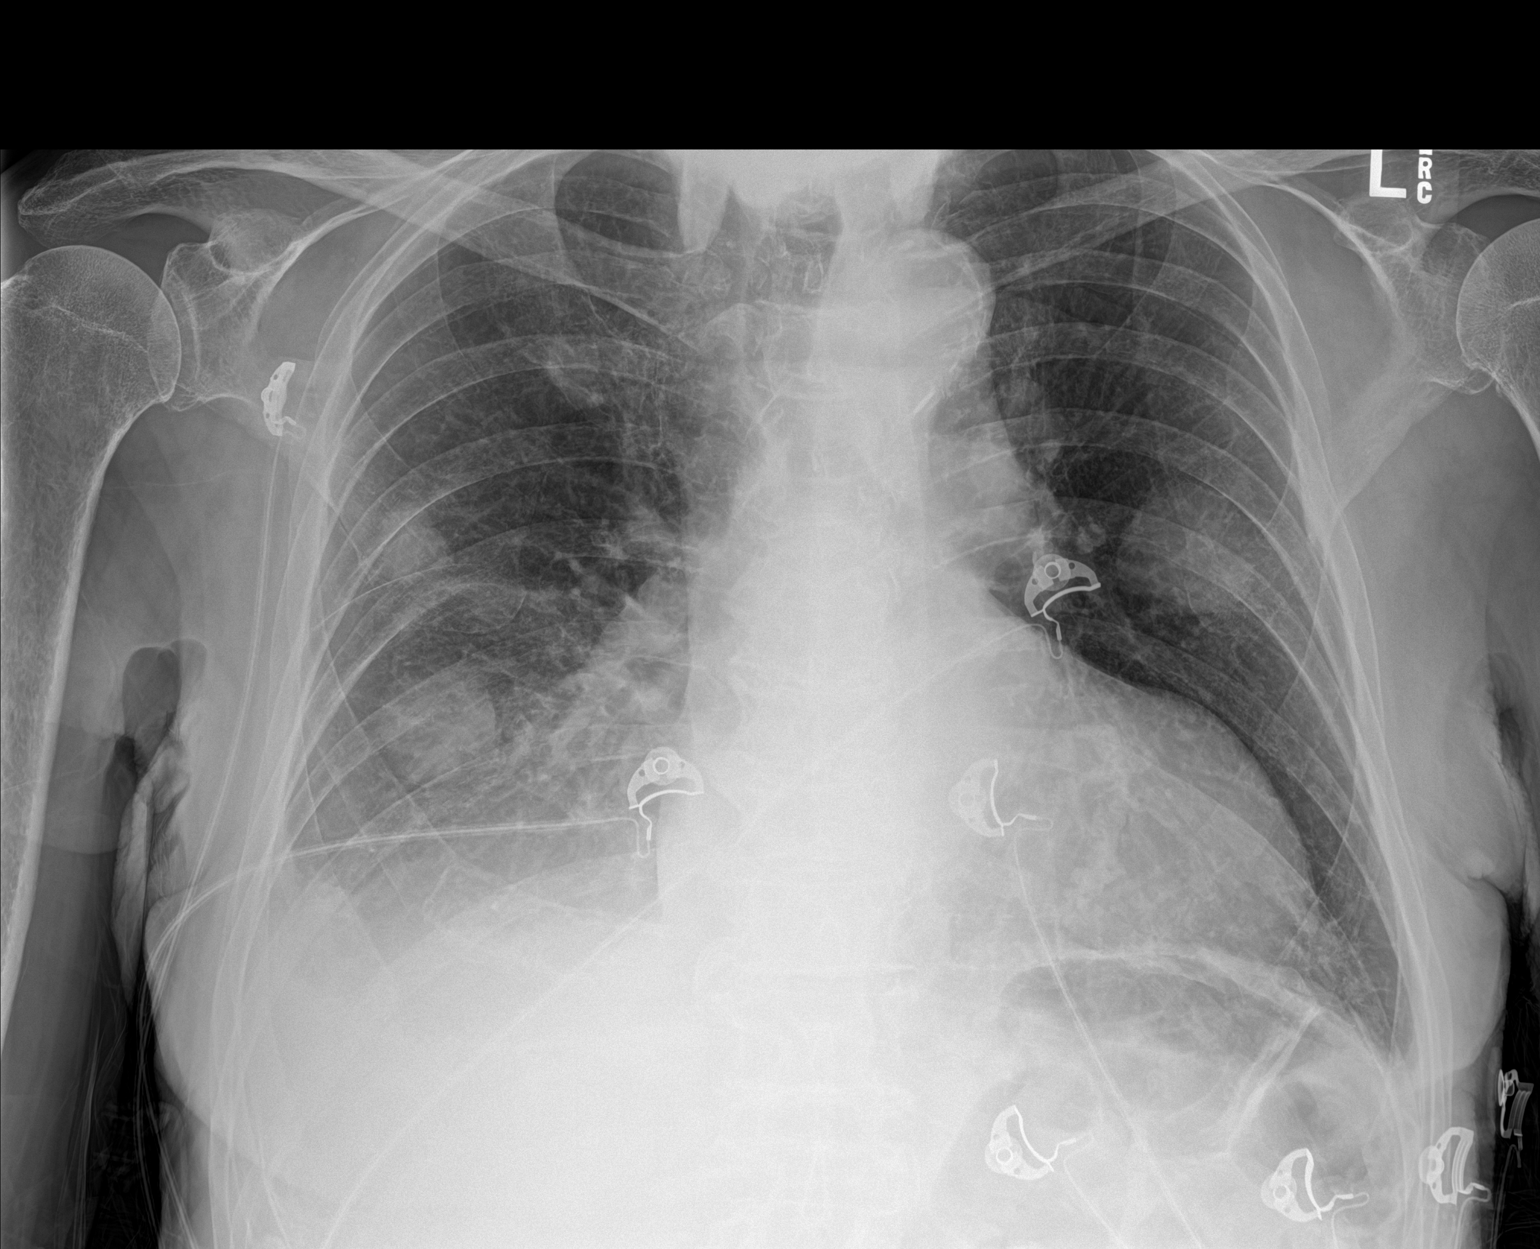

[chest ap (2 of 2)]
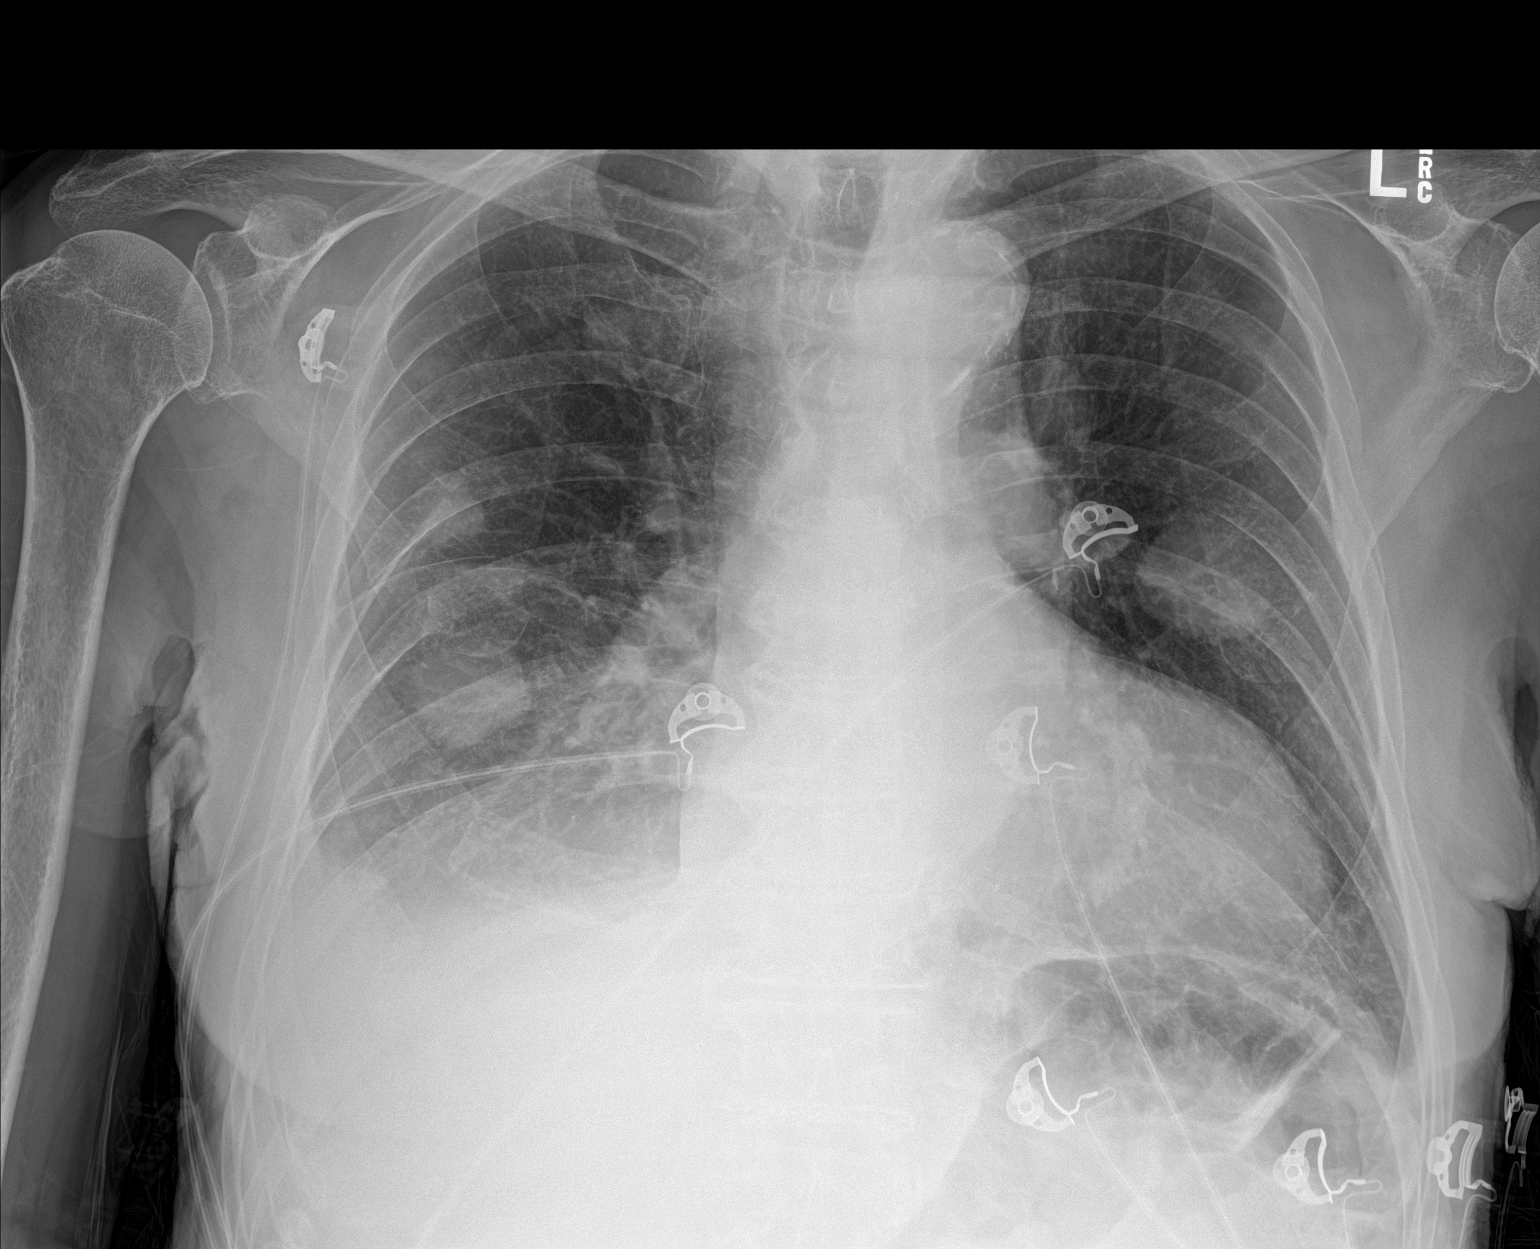

[2 of 2 positions shown; findings below may reference images not displayed]

FINDINGS: There are large masses within the lungs bilaterally concerning for
metastases. Largest is in the left mid lung measuring 3.7 cm.
Bilateral effusions, right greater than left. Bibasilar atelectasis.
Cardiomegaly. No acute bony abnormality.
IMPRESSION: Multiple rounded masslike opacities in the lungs bilaterally
concerning for metastases. This could be further evaluated with
chest CT with IV contrast.

Cardiomegaly.

Bilateral effusions, right greater than left. Bibasilar atelectasis.
